# Patient Record
Sex: Female | Born: 1995 | Race: Black or African American | Hispanic: No | Marital: Single | Smoking: Former smoker
Health system: Southern US, Community
[De-identification: ages and names within clinical notes are randomized; demographics above are authoritative.]

## PROBLEM LIST (undated history)

## (undated) DIAGNOSIS — Z8619 Personal history of other infectious and parasitic diseases: Secondary | ICD-10-CM

## (undated) DIAGNOSIS — K0889 Other specified disorders of teeth and supporting structures: Secondary | ICD-10-CM

## (undated) DIAGNOSIS — R112 Nausea with vomiting, unspecified: Secondary | ICD-10-CM

## (undated) HISTORY — DX: Nausea with vomiting, unspecified: R11.2

## (undated) HISTORY — DX: Personal history of other infectious and parasitic diseases: Z86.19

## (undated) HISTORY — DX: Other specified disorders of teeth and supporting structures: K08.89

---
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
## 2017-06-23 DIAGNOSIS — Z130 Encounter for screening for diseases of the blood and blood-forming organs and certain disorders involving the immune mechanism: Secondary | ICD-10-CM | POA: Diagnosis not present

## 2017-06-23 DIAGNOSIS — N938 Other specified abnormal uterine and vaginal bleeding: Secondary | ICD-10-CM | POA: Diagnosis not present

## 2017-06-23 DIAGNOSIS — Z309 Encounter for contraceptive management, unspecified: Secondary | ICD-10-CM | POA: Diagnosis not present

## 2017-06-23 DIAGNOSIS — Z23 Encounter for immunization: Secondary | ICD-10-CM | POA: Diagnosis not present

## 2017-06-23 DIAGNOSIS — Z3200 Encounter for pregnancy test, result unknown: Secondary | ICD-10-CM | POA: Diagnosis not present

## 2018-03-14 HISTORY — PX: THERAPEUTIC ABORTION: SHX798

## 2019-02-18 ENCOUNTER — Other Ambulatory Visit: Payer: Self-pay

## 2019-02-18 ENCOUNTER — Ambulatory Visit
Admission: EM | Admit: 2019-02-18 | Discharge: 2019-02-18 | Disposition: A | Discharge status: Home / Self Care | Payer: Self-pay | Attending: Family Medicine | Admitting: Family Medicine

## 2019-02-18 ENCOUNTER — Encounter: Payer: Self-pay | Admitting: Emergency Medicine

## 2019-02-18 DIAGNOSIS — R112 Nausea with vomiting, unspecified: Secondary | ICD-10-CM

## 2019-02-18 DIAGNOSIS — Z3490 Encounter for supervision of normal pregnancy, unspecified, unspecified trimester: Secondary | ICD-10-CM

## 2019-02-18 DIAGNOSIS — Z3201 Encounter for pregnancy test, result positive: Secondary | ICD-10-CM

## 2019-02-18 LAB — URINALYSIS, COMPLETE (UACMP) WITH MICROSCOPIC
Glucose, UA: NEGATIVE mg/dL
Hgb urine dipstick: NEGATIVE
Ketones, ur: >160 mg/dL — AB
Leukocytes,Ua: NEGATIVE
Nitrite: NEGATIVE
Protein, ur: 30 mg/dL — AB
RBC / HPF: NONE SEEN RBC/hpf (ref 0–5)
Specific Gravity, Urine: >1.03 — ABNORMAL HIGH (ref 1.005–1.030)
pH: 6 (ref 5.0–8.0)

## 2019-02-18 LAB — PREGNANCY, URINE: Preg Test, Ur: POSITIVE — AB

## 2019-02-18 NOTE — ED Provider Notes (Signed)
[facility]    CSN: [id number] Arrival date & time: 02/18/19  1225     History   Chief Complaint Chief Complaint  Patient presents with  . Emesis  . Generalized Body Aches    HPI Mary Cabrera is a 23 y.o. female.   23 yo female with a c/o nausea and vomiting since yesterday and several days of fatigue. Denies any fevers, chills, abdominal pain, diarrhea, urinary symptoms. States her last normal menstrual period was at the end of May and has had unprotected sexual intercourse.    Emesis   History reviewed. No pertinent past medical history.  There are no active problems to display for this patient.   History reviewed. No pertinent surgical history.  OB History   No obstetric history on file.      Home Medications    Prior to Admission medications   Not on File    Family History History reviewed. No pertinent family history.  Social History Social History   Tobacco Use  . Smoking status: Never Smoker  . Smokeless tobacco: Never Used  Substance Use Topics  . Alcohol use: Not Currently  . Drug use: Never     Allergies   Patient has no known allergies.   Review of Systems Review of Systems  Gastrointestinal: Positive for vomiting.     Physical Exam Triage Vital Signs ED Triage Vitals  Enc Vitals Group     BP 02/18/19 1249 (!) 128/99     Pulse Rate 02/18/19 1249 90     Resp 02/18/19 1249 18     Temp 02/18/19 1249 98.6 F (37 C)     Temp Source 02/18/19 1249 Oral     SpO2 02/18/19 1249 100 %     Weight 02/18/19 1249 98 lb (44.5 kg)     Height 02/18/19 1249 5\' 1"  (1.549 m)     Head Circumference --      Peak Flow --      Pain Score 02/18/19 1248 0     Pain Loc --      Pain Edu? --      Excl. in [place]? --    No data found.  Updated Vital Signs BP (!) 128/99 (BP Location: Right Arm)   Pulse 90   Temp 98.6 F (37 C) (Oral)   Resp 18   Ht 5\' 1"  (1.549 m)   Wt 44.5 kg   LMP 01/04/2019   SpO2 100%   BMI 18.52 kg/m    Visual Acuity Right Eye Distance:   Left Eye Distance:   Bilateral Distance:    Right Eye Near:   Left Eye Near:    Bilateral Near:     Physical Exam Vitals signs and nursing note reviewed.  Constitutional:      General: She is not in acute distress.    Appearance: She is not toxic-appearing or diaphoretic.  Abdominal:     General: There is no distension.     Palpations: Abdomen is soft.  Neurological:     Mental Status: She is alert.      UC Treatments / Results  Labs (all labs ordered are listed, but only abnormal results are displayed) Labs Reviewed  PREGNANCY, URINE - Abnormal; Notable for the following components:      Result Value   Preg Test, Ur POSITIVE (*)    All other components within normal limits  URINALYSIS, COMPLETE (UACMP) WITH MICROSCOPIC - Abnormal; Notable for the following components:   Specific  Gravity, Urine >1.030 (*)    Bilirubin Urine SMALL (*)    Ketones, ur >160 (*)    Protein, ur 30 (*)    Bacteria, UA RARE (*)    All other components within normal limits  URINE CULTURE    EKG   Radiology No results found.  Procedures Procedures (including critical care time)  Medications Ordered in UC Medications - No data to display  Initial Impression / Assessment and Plan / UC Course  I have reviewed the triage vital signs and the nursing notes.  Pertinent labs & imaging results that were available during my care of the patient were reviewed by me and considered in my medical decision making (see chart for details).      Final Clinical Impressions(s) / UC Diagnoses   Final diagnoses:  Pregnancy, unspecified gestational age  Non-intractable vomiting with nausea, unspecified vomiting type     Discharge Instructions     Over the counter Vitamin B6 (25mg ) plus half unisom tablet  Ginger products for nausea Start prenatal vitamin Establish care and follow up with an OB specialist    ED Prescriptions    None     1. Lab  results and diagnosis reviewed with patient 2. Recommend supportive treatment as above 3. Go to emergency department if vomiting symptoms worsen and unable to keep fluids down  4. Follow-up prn if symptoms worsen or don't improve   Controlled Substance Prescriptions [place] Controlled Substance Registry consulted? Not Applicable   Norval Gable, MD 02/18/19 [phone]

## 2019-02-18 NOTE — ED Triage Notes (Signed)
Patient states she started vomiting yesterday. She also c/o fatigue, generalized body aches and headache. No appetite since Sunday.

## 2019-02-18 NOTE — Discharge Instructions (Signed)
Over the counter Vitamin B6 (25mg ) plus half unisom tablet  Ginger products for nausea Start prenatal vitamin Establish care and follow up with an OB specialist

## 2019-02-19 LAB — URINE CULTURE: Culture: NO GROWTH

## 2019-03-21 LAB — OB RESULTS CONSOLE RUBELLA ANTIBODY, IGM
Rubella: IMMUNE
Rubella: IMMUNE

## 2019-03-24 ENCOUNTER — Ambulatory Visit: Payer: Medicaid Other | Admitting: Nurse Practitioner

## 2019-03-24 ENCOUNTER — Other Ambulatory Visit: Payer: Self-pay

## 2019-03-24 VITALS — BP 112/71 | Temp 98.0°F | Wt 99.2 lb

## 2019-03-24 DIAGNOSIS — N760 Acute vaginitis: Secondary | ICD-10-CM

## 2019-03-24 DIAGNOSIS — Z3480 Encounter for supervision of other normal pregnancy, unspecified trimester: Secondary | ICD-10-CM

## 2019-03-24 DIAGNOSIS — B9689 Other specified bacterial agents as the cause of diseases classified elsewhere: Secondary | ICD-10-CM

## 2019-03-24 LAB — URINALYSIS
Bilirubin, UA: NEGATIVE
Glucose, UA: NEGATIVE
Ketones, UA: NEGATIVE
Leukocytes,UA: NEGATIVE
Nitrite, UA: NEGATIVE
Protein,UA: NEGATIVE
Specific Gravity, UA: 1.02 (ref 1.005–1.030)
Urobilinogen, Ur: 0.2 mg/dL (ref 0.2–1.0)
pH, UA: 7 (ref 5.0–7.5)

## 2019-03-24 LAB — WET PREP FOR TRICH, YEAST, CLUE
Trichomonas Exam: NEGATIVE
Yeast Exam: NEGATIVE

## 2019-03-24 LAB — HIV ANTIBODY (ROUTINE TESTING W REFLEX): HIV 1&2 Ab, 4th Generation: NONREACTIVE

## 2019-03-24 LAB — HEMOGLOBIN, FINGERSTICK: Hemoglobin: 13.9 g/dL (ref 11.1–15.9)

## 2019-03-24 MED ORDER — METRONIDAZOLE 250 MG PO TABS: 250.0000 mg | ORAL_TABLET | Freq: Three times a day (TID) | ORAL | 0 refills | Status: AC

## 2019-03-24 NOTE — Patient Instructions (Signed)

## 2019-03-24 NOTE — Progress Notes (Signed)
[place] [address] [phone]  INITIAL PRENATAL VISIT NOTE  Subjective:  Mary Cabrera is a 23 y.o. G2P0010 at [redacted]w[redacted]d being seen today to start prenatal care at the [facility].  She is currently monitored for the following issues for this low-risk pregnancy and does not have a problem list on file.  Patient reports bleeding and /spotting that has resolved after last sexual encounter at the end of July.  Contractions: Not present. Vag. Bleeding: None.  Movement: Absent. Denies leaking of fluid.   The following portions of the patient's history were reviewed and updated as appropriate: allergies, current medications, past family history, past medical history, past social history, past surgical history and problem list. Problem list updated.  Objective:   Vitals:   03/24/19 1003  BP: 112/71  Temp: 98 F (36.7 C)  Weight: 99 lb 3.2 oz (45 kg)    Fetal Status: Fetal Heart Rate (bpm): 150   Movement: Absent     Physical Exam Vitals signs and nursing note reviewed.  Constitutional:      General: She is not in acute distress.    Appearance: Normal appearance. She is well-developed.  HENT:     Head: Normocephalic and atraumatic.     Mouth/Throat:     Lips: Pink.     Mouth: Mucous membranes are moist.     Dentition: Normal dentition. No dental caries.     Pharynx: Oropharynx is clear. Uvula midline.  Neck:     Musculoskeletal: Full passive range of motion without pain.     Thyroid: No thyroid mass or thyromegaly.  Cardiovascular:     Rate and Rhythm: Normal rate.  Pulmonary:     Effort: Pulmonary effort is normal.     Breath sounds: Normal breath sounds.  Chest:     Breasts: Breasts are symmetrical.        Right: Normal. No mass, nipple discharge or skin change.        Left: Normal. No mass, nipple discharge or skin change.  Abdominal:     General:  Abdomen is flat.     Palpations: Abdomen is soft.     Tenderness: There is no abdominal tenderness.     Comments: Gravid   Genitourinary:    General: Normal vulva.     Exam position: Lithotomy position.     Pubic Area: No rash.      Labia:        Right: No rash.        Left: No rash.      Vagina: Normal. No vaginal discharge.     Cervix: No cervical motion tenderness or friability.     Uterus: Normal. Enlarged (FH - 1 FB below SP). Not tender.      Adnexa: Right adnexa normal and left adnexa normal.     Rectum: Normal. No external hemorrhoid.  Lymphadenopathy:     Cervical: No cervical adenopathy.     Upper Body:     Right upper body: No axillary adenopathy.     Left upper body: No axillary adenopathy.  Skin:    General: Skin is warm and dry.  Neurological:     Mental Status: She is alert.   Nose ring noted Moderate amt yellowish discharge noted - > 4.5 ph, no foul odor noted   Assessment and Plan:  Pregnancy: G2P0010 at [redacted]w[redacted]d  1. Supervision of other normal pregnancy, antepartum  Client admits to doing well Reviewed with client OB ROS flow sheet questions  - HGB FRAC. W/SOLUBILITY - HIV [facility] - Prenatal profile without Varicella or Rubella - Chlamydia/GC NAA, Confirmation - IGP, Aptima HPV - Hemoglobin - WET PREP FOR TRICH, YEAST, CLUE - provider to review - US OB Comp Less 14 Wks; Future - Urinalysis (Urine Dip)  - Urinalysis - Urine Culture - Urine Drug Screen  Await results  2. Bacterial vaginosis Please treat client for BV per standing order  - metroNIDAZOLE (FLAGYL) 250 MG tablet; Take 1 tablet (250 mg total) by mouth 3 (three) times daily for 7 days.  Dispense: 1 tablet; Refill: 0  Client verbalizes understanding and is in agreement with plan of care   Discussed overview of care and coordination with [facility].   Reviewed Centering pregnancy as standard of care at  [facility], oriented to room and showed video. Based on EDD, plan for Cycle  - 73E .    Preterm labor symptoms and general obstetric precautions including but not limited to vaginal bleeding, contractions, leaking of fluid and fetal movement were reviewed in detail with the patient.  Please refer to After Visit Summary for other counseling recommendations.   Return in about 4 weeks (around 04/21/2019) for telehealth, routine prenatal care.  Future Appointments  Date Time Provider Department Center  04/18/2019 10:20 AM [facility] PROVIDER [facility] None    Donn PieriniKarla W Daden Mahany, NP

## 2019-03-24 NOTE — Progress Notes (Signed)
In for new ob; has PNV; initial labs today Debera Lat, RN +BV treated per Wilburn Mylar, FNP; informed will be called by [facility] scheduling for 1st screen Debera Lat, RN

## 2019-03-26 LAB — HGB FRAC. W/SOLUBILITY
Hgb A2 Quant: 2.4 % (ref 1.8–3.2)
Hgb A: 97.6 % (ref 96.4–98.8)
Hgb C: 0 %
Hgb F Quant: 0 % (ref 0.0–2.0)
Hgb S: 0 %
Hgb Solubility: NEGATIVE
Hgb Variant: 0 %

## 2019-03-26 LAB — PAP IG (IMAGE GUIDED): PAP Smear Comment: 0

## 2019-03-26 LAB — CBC/D/PLT+RPR+RH+ABO+AB SCR
Antibody Screen: NEGATIVE
Basophils Absolute: 0 10*3/uL (ref 0.0–0.2)
Basos: 0 %
EOS (ABSOLUTE): 0 10*3/uL (ref 0.0–0.4)
Eos: 0 %
Hematocrit: 37.4 % (ref 34.0–46.6)
Hemoglobin: 13.4 g/dL (ref 11.1–15.9)
Hepatitis B Surface Ag: NEGATIVE
Immature Grans (Abs): 0 10*3/uL (ref 0.0–0.1)
Immature Granulocytes: 0 %
Lymphocytes Absolute: 1.6 10*3/uL (ref 0.7–3.1)
Lymphs: 18 %
MCH: 30.8 pg (ref 26.6–33.0)
MCHC: 35.8 g/dL — ABNORMAL HIGH (ref 31.5–35.7)
MCV: 86 fL (ref 79–97)
Monocytes Absolute: 0.4 10*3/uL (ref 0.1–0.9)
Monocytes: 5 %
Neutrophils Absolute: 7 10*3/uL (ref 1.4–7.0)
Neutrophils: 77 %
Platelets: 193 10*3/uL (ref 150–450)
RBC: 4.35 x10E6/uL (ref 3.77–5.28)
RDW: 11.8 % (ref 11.7–15.4)
RPR Ser Ql: NONREACTIVE
Rh Factor: POSITIVE
WBC: 9.1 10*3/uL (ref 3.4–10.8)

## 2019-03-26 LAB — VARICELLA ZOSTER ANTIBODY, IGG: Varicella zoster IgG: 2170 index (ref >165)

## 2019-03-26 LAB — URINE CULTURE: Organism ID, Bacteria: NO GROWTH

## 2019-03-27 LAB — 789231 7+OXYCODONE-BUND
Amphetamines, Urine: NEGATIVE ng/mL
BENZODIAZ UR QL: NEGATIVE ng/mL
Barbiturate screen, urine: NEGATIVE ng/mL
Cocaine (Metab.): NEGATIVE ng/mL
OPIATE SCREEN URINE: NEGATIVE ng/mL
Oxycodone/Oxymorphone, Urine: NEGATIVE ng/mL
PCP Quant, Ur: NEGATIVE ng/mL

## 2019-03-27 LAB — CANNABINOID CONFIRMATION, UR: CANNABINOIDS: NEGATIVE

## 2019-04-01 ENCOUNTER — Encounter: Payer: Self-pay | Admitting: Nurse Practitioner

## 2019-04-01 DIAGNOSIS — R825 Elevated urine levels of drugs, medicaments and biological substances: Secondary | ICD-10-CM | POA: Insufficient documentation

## 2019-04-01 HISTORY — DX: Elevated urine levels of drugs, medicaments and biological substances: R82.5

## 2019-04-02 ENCOUNTER — Telehealth: Payer: Self-pay

## 2019-04-02 ENCOUNTER — Encounter: Payer: Self-pay | Admitting: Nurse Practitioner

## 2019-04-02 DIAGNOSIS — A568 Sexually transmitted chlamydial infection of other sites: Secondary | ICD-10-CM

## 2019-04-02 DIAGNOSIS — Z3480 Encounter for supervision of other normal pregnancy, unspecified trimester: Secondary | ICD-10-CM

## 2019-04-02 DIAGNOSIS — O98312 Other infections with a predominantly sexual mode of transmission complicating pregnancy, second trimester: Secondary | ICD-10-CM | POA: Insufficient documentation

## 2019-04-02 HISTORY — DX: Encounter for supervision of other normal pregnancy, unspecified trimester: Z34.80

## 2019-04-02 HISTORY — DX: Other infections with a predominantly sexual mode of transmission complicating pregnancy, second trimester: A56.8

## 2019-04-02 LAB — CHLAMYDIA/GC NAA, CONFIRMATION
Chlamydia trachomatis, NAA: POSITIVE — AB
Neisseria gonorrhoeae, NAA: NEGATIVE

## 2019-04-02 LAB — C. TRACHOMATIS NAA, CONFIRM: C. trachomatis NAA, Confirm: POSITIVE — AB

## 2019-04-02 NOTE — Progress Notes (Signed)
Updated client problem list to include "supervision of prenatal care"

## 2019-04-02 NOTE — Telephone Encounter (Signed)
Call to client; confirmed identity; and discussed +Chlamydia; scheduled 04/03/19 for Tx and aware to eat prior to appt. Debera Lat, RN

## 2019-04-03 ENCOUNTER — Other Ambulatory Visit: Payer: Medicaid Other

## 2019-04-03 ENCOUNTER — Other Ambulatory Visit: Payer: Self-pay | Admitting: Advanced Practice Midwife

## 2019-04-03 ENCOUNTER — Other Ambulatory Visit: Payer: Self-pay

## 2019-04-03 DIAGNOSIS — A749 Chlamydial infection, unspecified: Secondary | ICD-10-CM

## 2019-04-03 DIAGNOSIS — Z3480 Encounter for supervision of other normal pregnancy, unspecified trimester: Secondary | ICD-10-CM

## 2019-04-03 MED ORDER — PRENATAL VITAMINS 28-0.8 MG PO TABS: 1.0000 | ORAL_TABLET | Freq: Every day | ORAL | 11 refills | Status: DC

## 2019-04-03 MED ORDER — AZITHROMYCIN 500 MG PO TABS
1000.0000 mg | ORAL_TABLET | Freq: Once | ORAL | Status: AC
  Administered 2019-04-03: 1000 mg via ORAL

## 2019-04-07 ENCOUNTER — Other Ambulatory Visit: Payer: Self-pay

## 2019-04-07 DIAGNOSIS — Z20822 Contact with and (suspected) exposure to covid-19: Secondary | ICD-10-CM

## 2019-04-08 ENCOUNTER — Telehealth: Payer: Self-pay | Admitting: Family Medicine

## 2019-04-08 LAB — NOVEL CORONAVIRUS, NAA: SARS-CoV-2, NAA: NOT DETECTED

## 2019-04-08 NOTE — Telephone Encounter (Signed)
Returned call--reports has not been to pharmacy, wanted to check on script; informed that provider sent in and to let us know if they don't have it. Debera Lat, RN

## 2019-04-18 ENCOUNTER — Ambulatory Visit: Payer: Medicaid Other

## 2019-05-01 ENCOUNTER — Encounter: Payer: Self-pay | Admitting: Physician Assistant

## 2019-05-01 DIAGNOSIS — Z3480 Encounter for supervision of other normal pregnancy, unspecified trimester: Secondary | ICD-10-CM

## 2019-05-01 NOTE — Addendum Note (Signed)
Addended by: Cletis Media on: 05/01/2019 10:58 AM   Modules accepted: Orders

## 2019-05-05 ENCOUNTER — Emergency Department
Admission: EM | Admit: 2019-05-05 | Discharge: 2019-05-06 | Disposition: A | Discharge status: Home / Self Care | Payer: Medicaid Other | Attending: Emergency Medicine | Admitting: Emergency Medicine

## 2019-05-05 ENCOUNTER — Other Ambulatory Visit: Payer: Self-pay

## 2019-05-05 DIAGNOSIS — Z3A16 16 weeks gestation of pregnancy: Secondary | ICD-10-CM | POA: Diagnosis not present

## 2019-05-05 DIAGNOSIS — Z87891 Personal history of nicotine dependence: Secondary | ICD-10-CM | POA: Diagnosis not present

## 2019-05-05 DIAGNOSIS — N939 Abnormal uterine and vaginal bleeding, unspecified: Secondary | ICD-10-CM

## 2019-05-05 DIAGNOSIS — F1210 Cannabis abuse, uncomplicated: Secondary | ICD-10-CM | POA: Insufficient documentation

## 2019-05-05 DIAGNOSIS — O4692 Antepartum hemorrhage, unspecified, second trimester: Secondary | ICD-10-CM | POA: Diagnosis not present

## 2019-05-05 DIAGNOSIS — O26892 Other specified pregnancy related conditions, second trimester: Secondary | ICD-10-CM | POA: Diagnosis present

## 2019-05-05 LAB — COMPREHENSIVE METABOLIC PANEL
ALT: 15 U/L (ref 0–44)
AST: 20 U/L (ref 15–41)
Albumin: 3.5 g/dL (ref 3.5–5.0)
Alkaline Phosphatase: 47 U/L (ref 38–126)
Anion gap: 9 (ref 5–15)
BUN: 10 mg/dL (ref 6–20)
CO2: 23 mmol/L (ref 22–32)
Calcium: 8.7 mg/dL — ABNORMAL LOW (ref 8.9–10.3)
Chloride: 104 mmol/L (ref 98–111)
Creatinine, Ser: 0.44 mg/dL (ref 0.44–1.00)
GFR calc Af Amer: >60 mL/min (ref >60)
GFR calc non Af Amer: >60 mL/min (ref >60)
Glucose, Bld: 93 mg/dL (ref 70–99)
Potassium: 3.4 mmol/L — ABNORMAL LOW (ref 3.5–5.1)
Sodium: 136 mmol/L (ref 135–145)
Total Bilirubin: 0.4 mg/dL (ref 0.3–1.2)
Total Protein: 6.5 g/dL (ref 6.5–8.1)

## 2019-05-05 LAB — CBC
HCT: 33.4 % — ABNORMAL LOW (ref 36.0–46.0)
Hemoglobin: 11.6 g/dL — ABNORMAL LOW (ref 12.0–15.0)
MCH: 30.5 pg (ref 26.0–34.0)
MCHC: 34.7 g/dL (ref 30.0–36.0)
MCV: 87.9 fL (ref 80.0–100.0)
Platelets: 168 10*3/uL (ref 150–400)
RBC: 3.8 MIL/uL — ABNORMAL LOW (ref 3.87–5.11)
RDW: 12.9 % (ref 11.5–15.5)
WBC: 9 10*3/uL (ref 4.0–10.5)
nRBC: 0 % (ref 0.0–0.2)

## 2019-05-05 NOTE — ED Notes (Signed)
[place] said 15-20 minutes until they can get patient, patient and MD updated

## 2019-05-05 NOTE — ED Triage Notes (Addendum)
Pt states she started having vaginal bleeding during intercourse, pt is [redacted] weeks pregnant. Pt denies any pain at this time. This is first pregnancy, has had normal visits at [facility].

## 2019-05-05 NOTE — ED Notes (Signed)
ED Provider at bedside. 

## 2019-05-05 NOTE — ED Provider Notes (Signed)
[facility] Emergency Department Provider Note   First MD Initiated Contact with Patient 05/05/19 2312     (approximate)  I have reviewed the triage vital signs and the nursing notes.   HISTORY  Chief Complaint Vaginal Bleeding   HPI Mary Cabrera is a 23 y.o. female G1, P0 [redacted] weeks pregnant presents to the emergency department secondary to painless vaginal bleeding which patient states began approximately 20 minutes before arrival during intercourse.        Past Medical History:  Diagnosis Date  . History of Lyme disease 2012 - 2013  . Nausea & vomiting    with pregnancy  . Pain in a tooth or teeth     Patient Active Problem List   Diagnosis Date Noted  . Chlamydia trachomatis infection in pregnancy in second trimester 04/02/2019  . Supervision of other normal pregnancy, antepartum 04/02/2019  . Positive urine drug screen 04/01/2019    Past Surgical History:  Procedure Laterality Date  . THERAPEUTIC ABORTION  03/2018    Prior to Admission medications   Medication Sig Start Date End Date Taking? Authorizing Provider  Prenatal Vit-Fe Fumarate-FA (MULTIVITAMIN-PRENATAL) 27-0.8 MG TABS tablet Take 1 tablet by mouth daily at 12 noon.    [provider]  Prenatal Vit-Fe Fumarate-FA (PRENATAL VITAMINS) 28-0.8 MG TABS Take 1 tablet by mouth daily. 04/03/19   Herbie Saxon, CNM    Allergies Nickel  Family History  Problem Relation Age of Onset  . Hypertension Mother   . Thyroid disease Mother   . Hypertension Father   . Hypertension Maternal Grandmother   . Heart disease Maternal Grandmother   . Thyroid disease Maternal Grandmother   . Breast cancer Maternal Great-grandmother     Social History Social History   Tobacco Use  . Smoking status: Former Smoker    Types: Cigarettes    Quit date: 02/12/2019    Years since quitting: 0.2  . Smokeless tobacco: Never Used  Substance Use Topics  . Alcohol use: Not Currently    Comment: Passt ETOH use of beer, liquor.  . Drug use: Not Currently    Types: Marijuana    Review of Systems Constitutional: No fever/chills Eyes: No visual changes. ENT: No sore throat. Cardiovascular: Denies chest pain. Respiratory: Denies shortness of breath. Gastrointestinal: No abdominal pain.  No nausea, no vomiting.  No diarrhea.  No constipation. Genitourinary: Positive for vaginal bleeding Musculoskeletal: Negative for neck pain.  Negative for back pain. Integumentary: Negative for rash. Neurological: Negative for headaches, focal weakness or numbness.  ____________________________________________   PHYSICAL EXAM:  VITAL SIGNS: ED Triage Vitals  Enc Vitals Group     BP 05/05/19 2303 135/79     Pulse Rate 05/05/19 2303 94     Resp 05/05/19 2303 20     Temp 05/05/19 2303 98.4 F (36.9 C)     Temp Source 05/05/19 2303 Oral     SpO2 05/05/19 2303 99 %     Weight 05/05/19 2304 49 kg (108 lb)     Height 05/05/19 2304 1.575 m (5\' 2" )     Head Circumference --      Peak Flow --      Pain Score --      Pain Loc --      Pain Edu? --      Excl. in [place]? --     Constitutional: Alert and oriented.  Eyes: Conjunctivae are normal.  Mouth/Throat: Mucous membranes are moist. Neck: No stridor.  No meningeal signs.   Cardiovascular: Normal rate, regular rhythm. Good peripheral circulation. Grossly normal heart sounds. Respiratory: Normal respiratory effort.  No retractions. Gastrointestinal: Soft and nontender. No distention.  Genitourinary: Approximately 8 mL's of dark blood noted in vagina.  Cervix closed with no active bleeding from the cervix Musculoskeletal: No lower extremity tenderness nor edema. No gross deformities of extremities. Neurologic:  Normal speech and language. No gross focal neurologic deficits are appreciated.  Skin:  Skin is warm, dry and intact. Psychiatric: Mood and affect are normal. Speech and behavior are normal.   ____________________________________________   LABS (all labs ordered are listed, but only abnormal results are displayed)  Labs Reviewed  CBC - Abnormal; Notable for the following components:      Result Value   RBC 3.80 (*)    Hemoglobin 11.6 (*)    HCT 33.4 (*)    All other components within normal limits  COMPREHENSIVE METABOLIC PANEL - Abnormal; Notable for the following components:   Potassium 3.4 (*)    Calcium 8.7 (*)    All other components within normal limits  HCG, QUANTITATIVE, PREGNANCY - Abnormal; Notable for the following components:   hCG, Beta Chain, Quant, S 32,311 (*)    All other components within normal limits  URINALYSIS, COMPLETE (UACMP) WITH MICROSCOPIC - Abnormal; Notable for the following components:   Color, Urine RED (*)    APPearance TURBID (*)    Glucose, UA   (*)    Value: TEST NOT REPORTED DUE TO COLOR INTERFERENCE OF URINE PIGMENT   Hgb urine dipstick   (*)    Value: TEST NOT REPORTED DUE TO COLOR INTERFERENCE OF URINE PIGMENT   Bilirubin Urine   (*)    Value: TEST NOT REPORTED DUE TO COLOR INTERFERENCE OF URINE PIGMENT   Ketones, ur   (*)    Value: TEST NOT REPORTED DUE TO COLOR INTERFERENCE OF URINE PIGMENT   Protein, ur   (*)    Value: TEST NOT REPORTED DUE TO COLOR INTERFERENCE OF URINE PIGMENT   Nitrite   (*)    Value: TEST NOT REPORTED DUE TO COLOR INTERFERENCE OF URINE PIGMENT   Leukocytes,Ua   (*)    Value: TEST NOT REPORTED DUE TO COLOR INTERFERENCE OF URINE PIGMENT   RBC / HPF >50 (*)    All other components within normal limits  ABO/RH    ____________________________________________  RADIOLOGY I, Tolleson N , personally viewed and evaluated these images (plain radiographs) as part of my medical decision making, as well as reviewing the written report by the radiologist.  ED MD interpretation: Single live intrauterine pregnancy 18 weeks 4 days without any explanation for the patient's vaginal bleeding.  No evidence of  placenta previa per radiologist  Official radiology report(s): US Ob Limited  Result Date: 05/06/2019 CLINICAL DATA:  Pregnant patient in second trimester pregnancy with vaginal bleeding today. EXAM: LIMITED OBSTETRIC ULTRASOUND FINDINGS: Number of Fetuses: 1 Heart Rate:  149 bpm Movement: Yes Presentation: Breech Placental Location: Anterior.  Placental lakes noted. Previa: No Amniotic Fluid (Subjective):  Within normal limits. BPD: 4.16 cm 18 w  4 d MATERNAL FINDINGS: Cervix:  Appears closed. Uterus/Adnexae: Neither ovary is visualized. No abnormality visualized. IMPRESSION: Single live intrauterine pregnancy estimated gestational age [redacted] weeks 4 days based on biparietal diameter. Ultrasound [facility] 10/03/2019. No explanation for vaginal bleeding. This exam is performed on an emergent basis and does not comprehensively evaluate fetal size, dating, or anatomy; follow-up complete OB US should be  considered if further fetal assessment is warranted. Electronically Signed   By: Narda Rutherford M.D.   On: 05/06/2019 01:49      Procedures   ____________________________________________   INITIAL IMPRESSION / MDM / ASSESSMENT AND PLAN / ED COURSE  As part of my medical decision making, I reviewed the following data within the electronic MEDICAL RECORD NUMBER  23 year old female presented with above-stated history and physical exam secondary to painless vaginal bleeding in the second trimester with onset during intercourse.  Considered possibly of placenta previa ultrasound revealed no evidence of previa per radiologist.  Patient's blood type is O+.  Also considered possibility of threatened miscarriage as well.  I spoke with the patient and his spouse at length regarding all clinical findings and necessity of following up with OB/GYN  ____________________________________________  FINAL CLINICAL IMPRESSION(S) / ED DIAGNOSES  Final diagnoses:  Vaginal bleeding in pregnancy, second trimester      MEDICATIONS GIVEN DURING THIS VISIT:  Medications - No data to display   ED Discharge Orders    None      *Please note:  HATSUMI TRAUGHBER was evaluated in Emergency Department on 05/06/2019 for the symptoms described in the history of present illness. She was evaluated in the context of the global COVID-19 pandemic, which necessitated consideration that the patient might be at risk for infection with the SARS-CoV-2 virus that causes COVID-19. Institutional protocols and algorithms that pertain to the evaluation of patients at risk for COVID-19 are in a state of rapid change based on information released by regulatory bodies including the CDC and federal and state organizations. These policies and algorithms were followed during the patient's care in the ED.  Some ED evaluations and interventions may be delayed as a result of limited staffing during the pandemic.*  Note:  This document was prepared using Dragon voice recognition software and may include unintentional dictation errors.   Darci Current, MD 05/06/19 [phone]

## 2019-05-05 NOTE — ED Notes (Signed)
EDP performed vaginal exam, patient to go to [place]

## 2019-05-06 ENCOUNTER — Emergency Department: Payer: Medicaid Other

## 2019-05-06 LAB — URINALYSIS, COMPLETE (UACMP) WITH MICROSCOPIC
Bacteria, UA: NONE SEEN
RBC / HPF: >50 RBC/hpf — ABNORMAL HIGH (ref 0–5)
Specific Gravity, Urine: 1.022 (ref 1.005–1.030)

## 2019-05-06 LAB — HCG, QUANTITATIVE, PREGNANCY: hCG, Beta Chain, Quant, S: 32311 m[IU]/mL — ABNORMAL HIGH (ref <5)

## 2019-05-06 LAB — ABO/RH: ABO/RH(D): O POS

## 2019-05-06 NOTE — ED Notes (Signed)
Patient transported to Ultrasound 

## 2019-05-06 NOTE — ED Notes (Signed)
Patient still in US.

## 2019-07-31 ENCOUNTER — Encounter: Payer: Self-pay | Admitting: Family Medicine

## 2019-07-31 NOTE — Progress Notes (Signed)
Pap NIL- 03/24/19. Next in 3 years. RN to send pap card.

## 2019-08-04 NOTE — Progress Notes (Signed)
Pap card mailed. Next pap in 3 years per Newton Kaimen Peine, RN  

## 2019-09-26 IMAGING — US US OB LIMITED
1 series · 14 of 24 positions shown · non-contrast
Comparison: none

CLINICAL DATA: Pregnant patient in second trimester pregnancy with
vaginal bleeding today.

EXAM:
LIMITED OBSTETRIC ULTRASOUND

[Series 1: us ob limited · 0.30mm/px · 24 acquisitions, 14 frames shown]
[im 1/24]
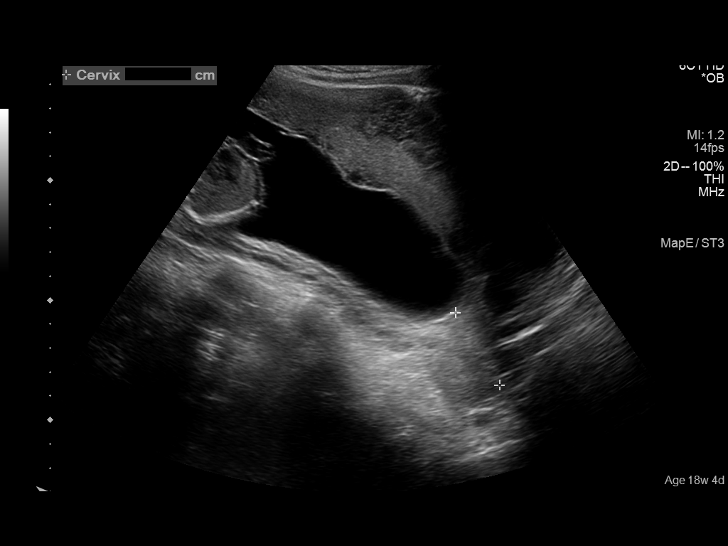
[im 3/24]
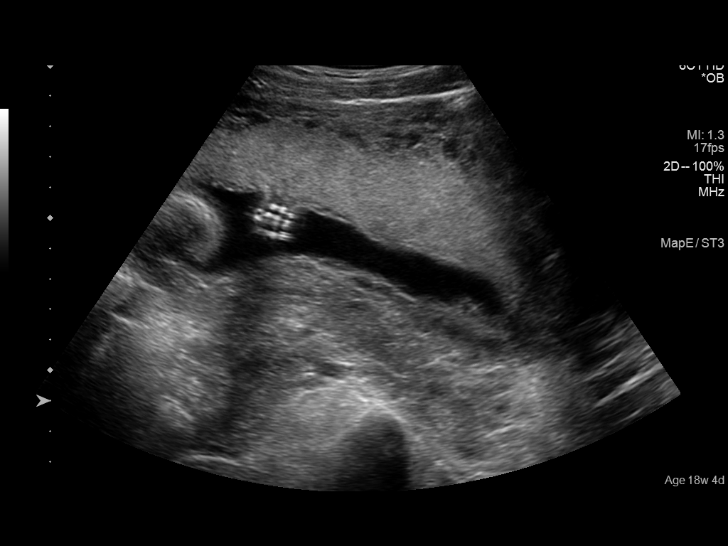
[im 5/24]
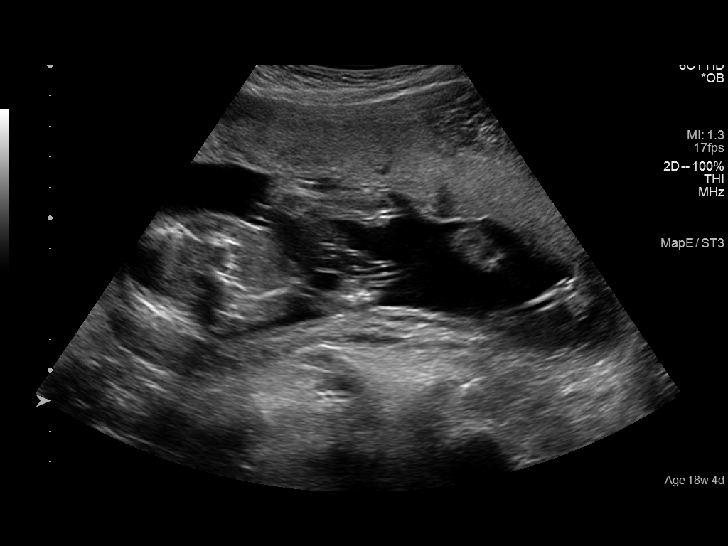
[im 7/24]
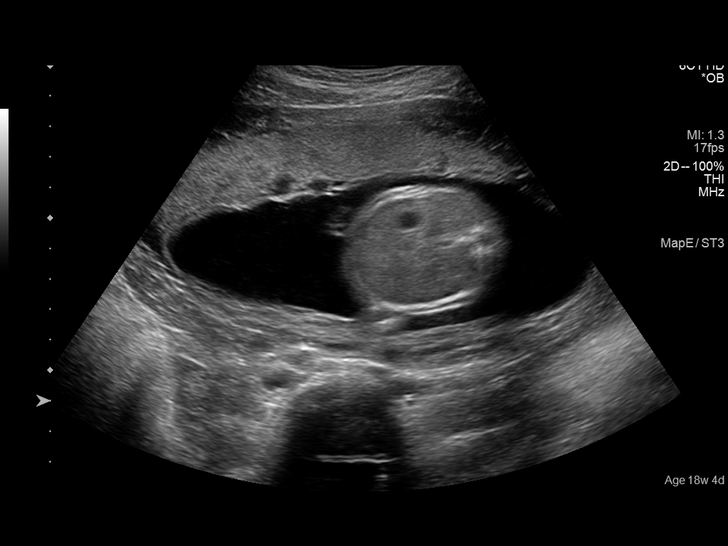
[im 8/24]
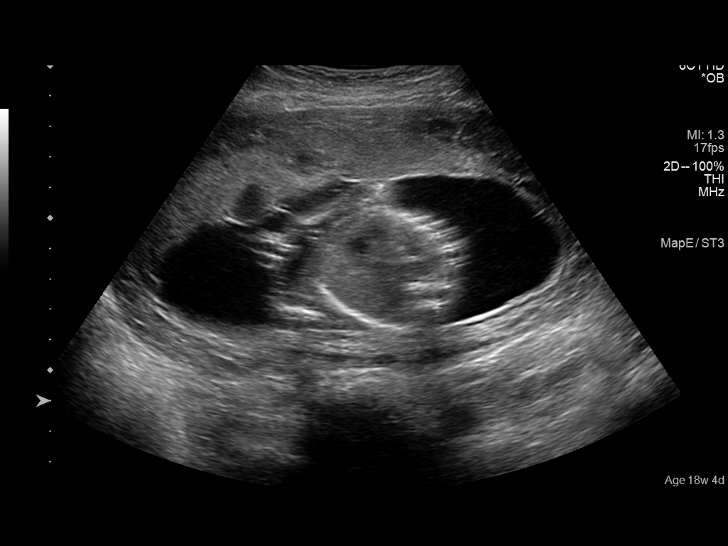
[im 10/24]
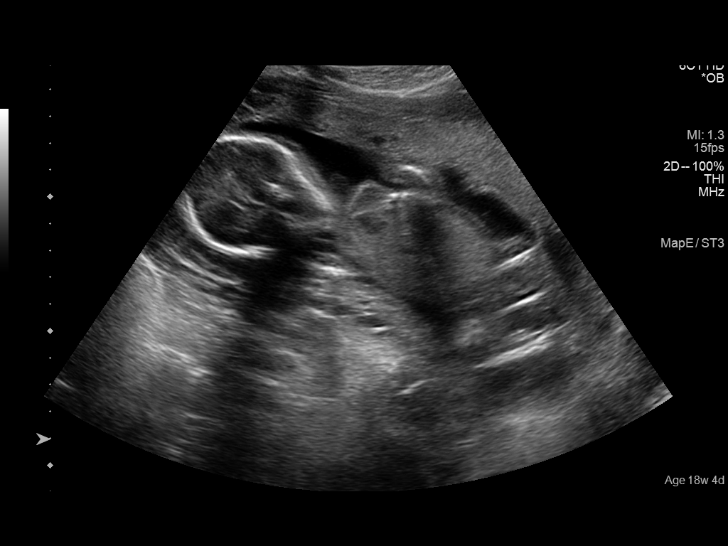
[im 12/24]
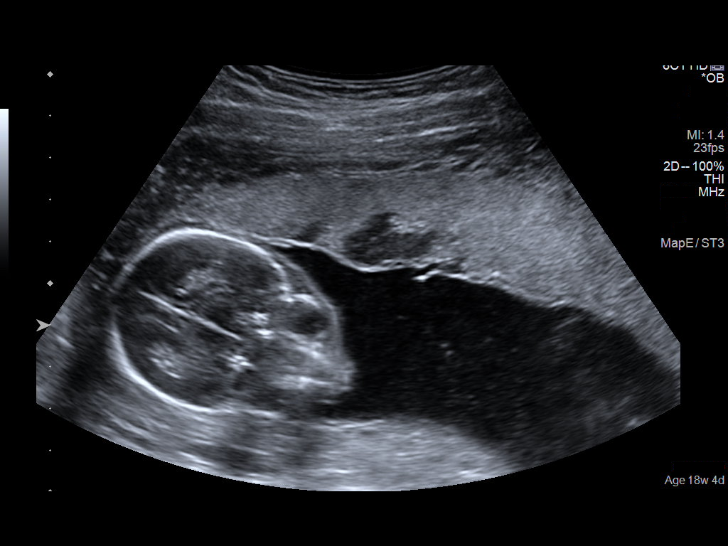
[im 13/24]
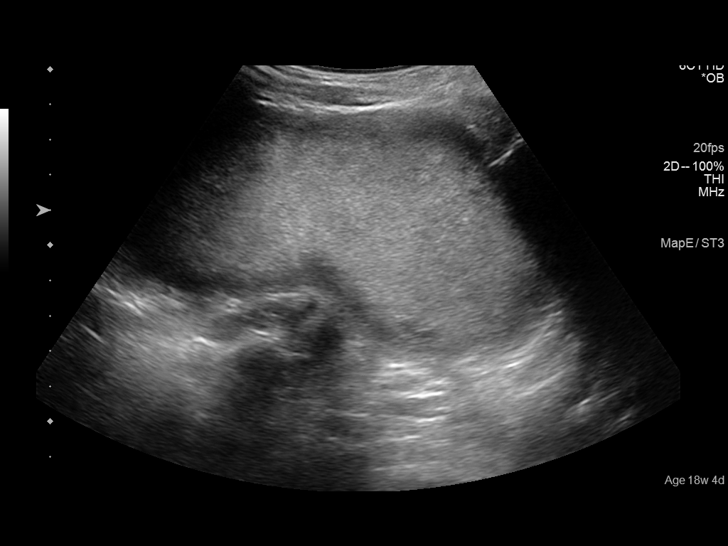
[im 15/24]
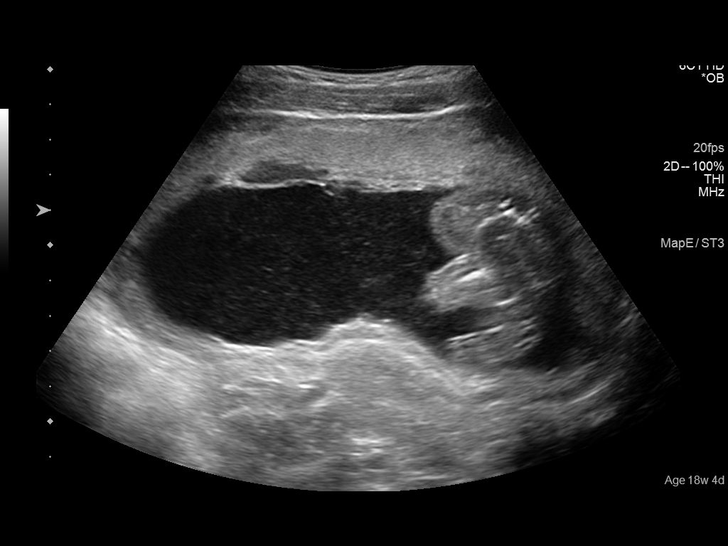
[im 17/24]
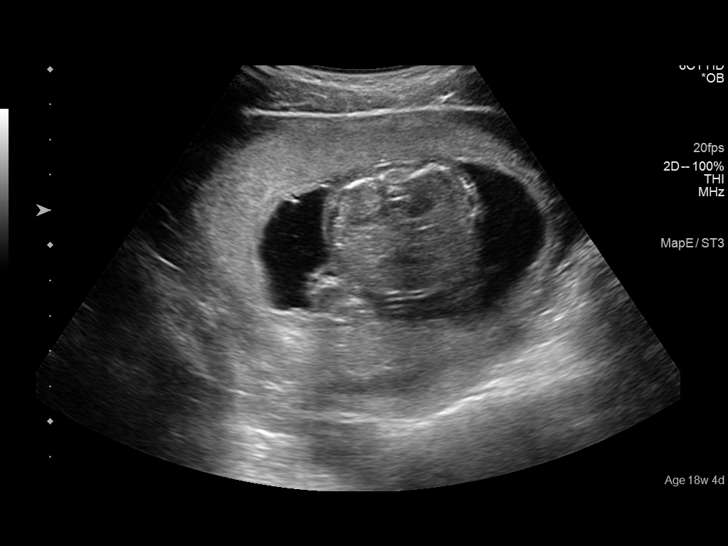
[im 19/24]
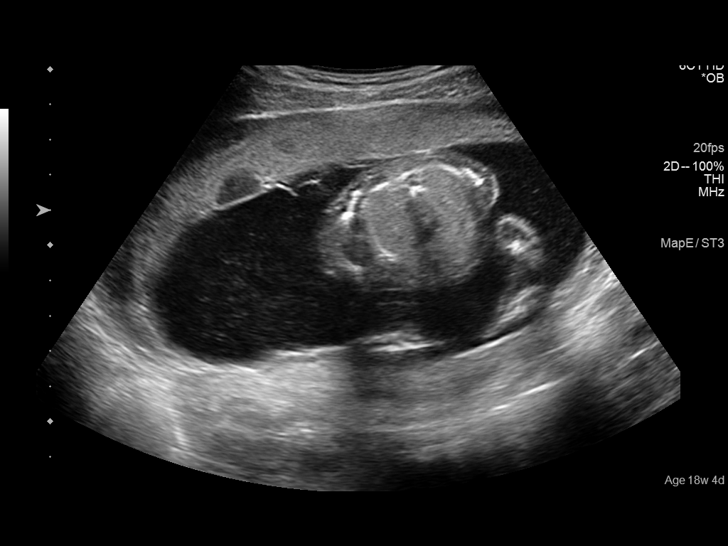
[im 20/24]
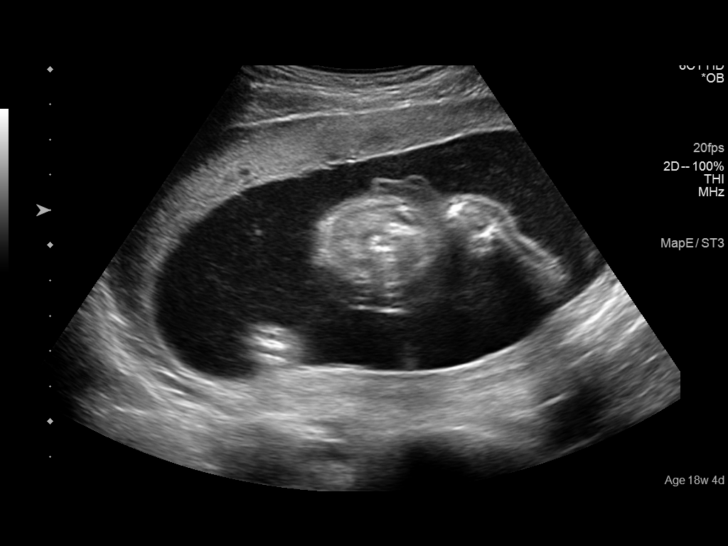
[im 22/24]
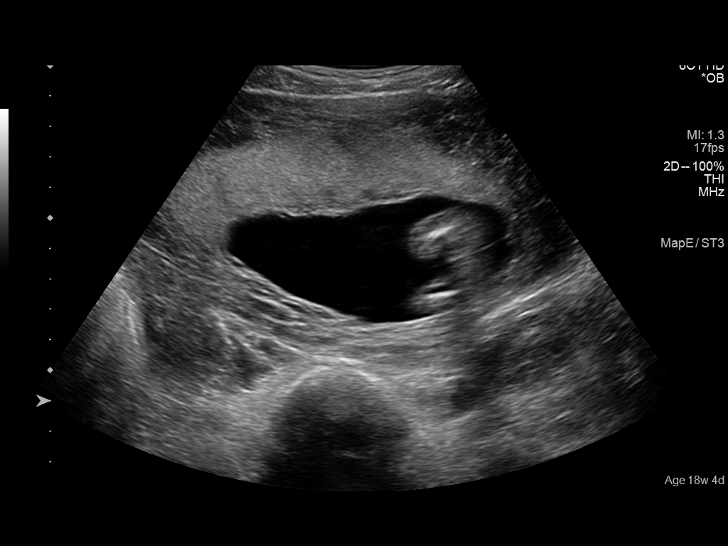
[im 24/24]
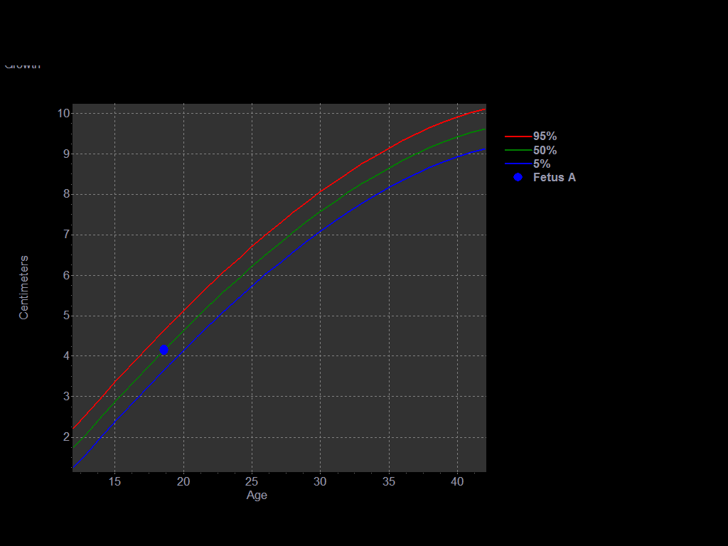

[14 of 24 positions shown; findings below may reference images not displayed]

FINDINGS: Number of Fetuses: 1

Heart Rate:  149 bpm

Movement: Yes

Presentation: Breech

Placental Location: Anterior.  Placental lakes noted.

Previa: No

Amniotic Fluid (Subjective):  Within normal limits.

BPD: 4.16 cm 18 w  4 d

MATERNAL FINDINGS:

Cervix:  Appears closed.

Uterus/Adnexae: Neither ovary is visualized. No abnormality
visualized.
IMPRESSION: Single live intrauterine pregnancy estimated gestational age 18
weeks 4 days based on biparietal diameter. Ultrasound [facility]
10/03/2019. No explanation for vaginal bleeding.

This exam is performed on an emergent basis and does not
comprehensively evaluate fetal size, dating, or anatomy; follow-up
complete OB US should be considered if further fetal assessment is
warranted.

## 2019-11-14 ENCOUNTER — Ambulatory Visit: Payer: Medicaid Other

## 2019-11-15 ENCOUNTER — Ambulatory Visit: Payer: Medicaid Other | Attending: Internal Medicine

## 2019-11-15 DIAGNOSIS — Z23 Encounter for immunization: Secondary | ICD-10-CM

## 2019-11-15 NOTE — Progress Notes (Signed)
   Covid-19 Vaccination Clinic  Name:  Mary Cabrera    MRN: [id number] DOB: February 13, 1996  11/15/2019  Mary Cabrera was observed post Covid-19 immunization for 15 minutes without incident. She was provided with Vaccine Information Sheet and instruction to access the V-Safe system.   Mary Cabrera was instructed to call 911 with any severe reactions post vaccine: Marland Kitchen Difficulty breathing  . Swelling of face and throat  . A fast heartbeat  . A bad rash all over body  . Dizziness and weakness   Immunizations Administered    Name Date Dose VIS Date Route   Pfizer COVID-19 Vaccine 11/15/2019 11:26 AM 0.3 mL 07/25/2019 Intramuscular   Manufacturer: [employer], [employer]   Lot: [id number]   NDC: [address]-5

## 2019-12-06 ENCOUNTER — Ambulatory Visit: Payer: Medicaid Other | Attending: Internal Medicine

## 2019-12-06 DIAGNOSIS — Z23 Encounter for immunization: Secondary | ICD-10-CM

## 2019-12-06 NOTE — Progress Notes (Signed)
   Covid-19 Vaccination Clinic  Name:  Mary Cabrera    MRN: [id number] DOB: 13-Apr-1996  12/06/2019  [name] was observed post Covid-19 immunization for 15 minutes without incident. She was provided with Vaccine Information Sheet and instruction to access the V-Safe system.   [name] was instructed to call 911 with any severe reactions post vaccine: Marland Kitchen Difficulty breathing  . Swelling of face and throat  . A fast heartbeat  . A bad rash all over body  . Dizziness and weakness   Immunizations Administered    Name Date Dose VIS Date Route   Pfizer COVID-19 Vaccine 12/06/2019 11:16 AM 0.3 mL 10/08/2018 Intramuscular   Manufacturer: [employer], [employer]   Lot: [id number]   NDC: [address]-5

## 2020-04-26 ENCOUNTER — Other Ambulatory Visit: Payer: Self-pay | Admitting: *Deleted

## 2020-04-26 ENCOUNTER — Other Ambulatory Visit: Payer: Medicaid Other

## 2020-04-26 DIAGNOSIS — Z20822 Contact with and (suspected) exposure to covid-19: Secondary | ICD-10-CM

## 2020-04-27 LAB — NOVEL CORONAVIRUS, NAA: SARS-CoV-2, NAA: NOT DETECTED

## 2020-04-27 LAB — SARS-COV-2, NAA 2 DAY TAT

## 2020-08-23 ENCOUNTER — Other Ambulatory Visit: Payer: Medicaid Other

## 2020-08-23 DIAGNOSIS — Z20822 Contact with and (suspected) exposure to covid-19: Secondary | ICD-10-CM

## 2020-08-27 LAB — NOVEL CORONAVIRUS, NAA: SARS-CoV-2, NAA: DETECTED — AB

## 2021-11-24 ENCOUNTER — Encounter: Payer: Medicaid Other | Admitting: Obstetrics

## 2021-12-07 ENCOUNTER — Ambulatory Visit (INDEPENDENT_AMBULATORY_CARE_PROVIDER_SITE_OTHER): Payer: Medicaid Other | Admitting: Obstetrics

## 2021-12-07 ENCOUNTER — Other Ambulatory Visit (HOSPITAL_COMMUNITY)
Admission: RE | Admit: 2021-12-07 | Discharge: 2021-12-07 | Disposition: A | Discharge status: Home / Self Care | Payer: Medicaid Other | Source: Ambulatory Visit | Attending: Obstetrics | Admitting: Obstetrics

## 2021-12-07 VITALS — BP 122/83 | HR 56 | Wt 89.0 lb

## 2021-12-07 DIAGNOSIS — Z124 Encounter for screening for malignant neoplasm of cervix: Secondary | ICD-10-CM | POA: Diagnosis present

## 2021-12-07 DIAGNOSIS — Z0000 Encounter for general adult medical examination without abnormal findings: Secondary | ICD-10-CM

## 2021-12-07 DIAGNOSIS — A5909 Other urogenital trichomoniasis: Secondary | ICD-10-CM | POA: Insufficient documentation

## 2021-12-07 DIAGNOSIS — Z113 Encounter for screening for infections with a predominantly sexual mode of transmission: Secondary | ICD-10-CM

## 2021-12-07 DIAGNOSIS — L989 Disorder of the skin and subcutaneous tissue, unspecified: Secondary | ICD-10-CM

## 2021-12-07 DIAGNOSIS — R627 Adult failure to thrive: Secondary | ICD-10-CM | POA: Diagnosis not present

## 2021-12-07 DIAGNOSIS — Z01419 Encounter for gynecological examination (general) (routine) without abnormal findings: Secondary | ICD-10-CM | POA: Diagnosis not present

## 2021-12-07 DIAGNOSIS — I499 Cardiac arrhythmia, unspecified: Secondary | ICD-10-CM

## 2021-12-07 NOTE — Progress Notes (Signed)
SUBJECTIVE ? ?HPI ? ?Mary Cabrera is a 26 y.o.-year-old female who presents for an annual gynecological exam, Pap smear, and STI today. She would like her thyroid checked due to a family history of thyroid disorder. She reports fatigue, night sweats, inability to gain weight, and an occasional jittery/fluttery feeling in her chest. She also reports a lesion on her nose that continues to flake and be discolored. She has no other health concerns today. ? ?Medical/Surgical History ?Past Medical History:  ?Diagnosis Date  ? History of Lyme disease 2012 - 2013  ? Nausea & vomiting   ? with pregnancy  ? Pain in a tooth or teeth   ? ?Past Surgical History:  ?Procedure Laterality Date  ? THERAPEUTIC ABORTION  03/2018  ? ? ?Social History ?Lives with mother and 50-year-old son ?Works from home ?Exercise: chasing toddler ?Substances: EtOH 3x/week, daily vaping, denies tobacco and recreational drugs  ? ?Obstetric History ?OB History   ? ? Gravida  ?2  ? Para  ?0  ? Term  ?0  ? Preterm  ?0  ? AB  ?1  ? Living  ?0  ?  ? ? SAB  ?0  ? IAB  ?1  ? Ectopic  ?0  ? Multiple  ?0  ? Live Births  ?0  ?   ?  ?  ?  ? ?GYN/Menstrual History ?Patient's last menstrual period was 10/20/2021 (approximate). ?Regular periods approximately every other month with Nexplanon ?Last Pap: 3 years ago, normal ?Contraception: Nexplanon ? ?Prevention ?Mammogram at 57 ? ?Current Medications ?Outpatient Medications Prior to Visit  ?Medication Sig  ? Prenatal Vit-Fe Fumarate-FA (MULTIVITAMIN-PRENATAL) 27-0.8 MG TABS tablet Take 1 tablet by mouth daily at 12 noon.  ? Prenatal Vit-Fe Fumarate-FA (PRENATAL VITAMINS) 28-0.8 MG TABS Take 1 tablet by mouth daily.  ? ?No facility-administered medications prior to visit.  ?  ? ? ?The pregnancy intention screening data noted above was reviewed. Potential methods of contraception were discussed. The patient elected to proceed with No data recorded.  ? ?ROS ?History obtained from chart review ?General ROS: positive for   - fatigue and night sweats ?negative for - chills or fever ?Psychological ROS: negative for - anxiety or depression ?Ophthalmic ROS: negative for - decreased vision ?Hematological and Lymphatic ROS: negative for - bleeding problems or swollen lymph nodes ?Endocrine ROS: positive for - inability to gain weight ?negative for - breast changes or palpitations ?Breast ROS: negative for breast lumps ?Respiratory ROS: no cough, shortness of breath, or wheezing ?Cardiovascular ROS: no chest pain or dyspnea on exertion ?Gastrointestinal ROS: no abdominal pain, change in bowel habits, or black or bloody stools ?Genito-Urinary ROS: no dysuria, trouble voiding, or hematuria ?Musculoskeletal ROS: negative ?Dermatological ROS: negative - reports lesion on nose ? ?   ? View : No data to display.  ?  ?  ?  ?  ? ?OBJECTIVE ? ?Last Weight  Most recent update: 12/07/2021 10:37 AM  ? ? Weight  ?40.4 kg (89 lb)  ?      ? ?  ?  ?Body mass index is 16.28 kg/m?.  ? ? ?BP 122/83   Pulse (!) 56   Wt 89 lb (40.4 kg)   LMP 10/20/2021 (Approximate)   BMI 16.28 kg/m?  ?General appearance: alert, cooperative, and appears stated age ?Head: Normocephalic, without obvious abnormality, atraumatic ?Eyes: negative findings: lids and lashes normal and conjunctivae and sclerae normal ?Neck: no adenopathy, supple, symmetrical, trachea midline, and thyroid not enlarged, symmetric, no tenderness/mass/nodules ?  Lungs: clear to auscultation bilaterally ?Breasts:  declined ?Heart: irregularly irregular rhythm, S1, S2 normal, and no murmur ?Abdomen: soft, non-tender; bowel sounds normal; no masses,  no organomegaly ?Pelvic: cervix normal in appearance, external genitalia normal, no cervical motion tenderness, rectovaginal septum normal, vagina normal without discharge, and Pap collected ?Extremities: extremities normal, atraumatic, no cyanosis or edema ?Pulses: 2+ and symmetric ?Skin: Skin color, texture, turgor normal. No rashes or lesions  ?Lymph nodes:  Cervical, supraclavicular, and axillary nodes normal. ? ?ASSESSMENT  ?1) Annual exam ?2) Pap due ?3) Irregular heart rate ?4) Skin lesion  ? ?PLAN ?1) Physical exam as noted. STI swabs and blood work collected. Labs: thyroid panel and antibodies, A1C, CBC ?2) Pap collected; f/u based on results ?3) Referral to cardiology ?4) Referral to dermatology for evaluation ? ? ?Lloyd Huger, CNM   ?

## 2021-12-08 ENCOUNTER — Other Ambulatory Visit: Payer: Self-pay | Admitting: Obstetrics

## 2021-12-08 ENCOUNTER — Encounter: Payer: Self-pay | Admitting: Obstetrics

## 2021-12-08 LAB — THYROID ANTIBODIES
Thyroglobulin Antibody: <1 IU/mL (ref 0.0–0.9)
Thyroperoxidase Ab SerPl-aCnc: <9 IU/mL (ref 0–34)

## 2021-12-08 LAB — CERVICOVAGINAL ANCILLARY ONLY
Bacterial Vaginitis (gardnerella): NEGATIVE
Candida Glabrata: NEGATIVE
Candida Vaginitis: NEGATIVE
Chlamydia: NEGATIVE
Comment: NEGATIVE
Comment: NEGATIVE
Comment: NEGATIVE
Comment: NEGATIVE
Comment: NEGATIVE
Comment: NORMAL
Neisseria Gonorrhea: NEGATIVE
Trichomonas: POSITIVE — AB

## 2021-12-08 LAB — CBC WITH DIFFERENTIAL/PLATELET
Basophils Absolute: 0 10*3/uL (ref 0.0–0.2)
Basos: 0 %
EOS (ABSOLUTE): 0 10*3/uL (ref 0.0–0.4)
Eos: 0 %
Hematocrit: 41.3 % (ref 34.0–46.6)
Hemoglobin: 14.3 g/dL (ref 11.1–15.9)
Immature Grans (Abs): 0 10*3/uL (ref 0.0–0.1)
Immature Granulocytes: 0 %
Lymphocytes Absolute: 1.6 10*3/uL (ref 0.7–3.1)
Lymphs: 20 %
MCH: 29.5 pg (ref 26.6–33.0)
MCHC: 34.6 g/dL (ref 31.5–35.7)
MCV: 85 fL (ref 79–97)
Monocytes Absolute: 0.3 10*3/uL (ref 0.1–0.9)
Monocytes: 4 %
Neutrophils Absolute: 6.3 10*3/uL (ref 1.4–7.0)
Neutrophils: 76 %
Platelets: 235 10*3/uL (ref 150–450)
RBC: 4.84 x10E6/uL (ref 3.77–5.28)
RDW: 12.1 % (ref 11.7–15.4)
WBC: 8.3 10*3/uL (ref 3.4–10.8)

## 2021-12-08 LAB — HEMOGLOBIN A1C
Est. average glucose Bld gHb Est-mCnc: 111 mg/dL
Hgb A1c MFr Bld: 5.5 % (ref 4.8–5.6)

## 2021-12-08 LAB — THYROID PANEL WITH TSH
Free Thyroxine Index: 1.8 (ref 1.2–4.9)
T3 Uptake Ratio: 25 % (ref 24–39)
T4, Total: 7.1 ug/dL (ref 4.5–12.0)
TSH: 0.562 u[IU]/mL (ref 0.450–4.500)

## 2021-12-08 LAB — HEPATITIS B SURFACE ANTIGEN: Hepatitis B Surface Ag: NEGATIVE

## 2021-12-08 LAB — HEPATITIS C ANTIBODY: Hep C Virus Ab: NONREACTIVE

## 2021-12-08 LAB — HEPATITIS B SURFACE ANTIBODY,QUALITATIVE: Hep B Surface Ab, Qual: REACTIVE

## 2021-12-08 LAB — HIV ANTIBODY (ROUTINE TESTING W REFLEX): HIV Screen 4th Generation wRfx: NONREACTIVE

## 2021-12-08 LAB — RPR: RPR Ser Ql: NONREACTIVE

## 2021-12-08 MED ORDER — METRONIDAZOLE 500 MG PO TABS: 500.0000 mg | ORAL_TABLET | Freq: Two times a day (BID) | ORAL | 0 refills | Status: DC

## 2021-12-08 NOTE — Progress Notes (Signed)
+   trich. Metronidazole 500 mg PO BID x 7 days sent to pharmacy. Anamari notified via MyChart. ? ?M. Chryl Heck, CNM ?

## 2021-12-09 LAB — CYTOLOGY - PAP: Diagnosis: NEGATIVE

## 2021-12-15 ENCOUNTER — Encounter: Payer: Self-pay | Admitting: Obstetrics

## 2022-01-13 ENCOUNTER — Ambulatory Visit (INDEPENDENT_AMBULATORY_CARE_PROVIDER_SITE_OTHER): Payer: Medicaid Other | Admitting: Cardiology

## 2022-01-13 ENCOUNTER — Ambulatory Visit (INDEPENDENT_AMBULATORY_CARE_PROVIDER_SITE_OTHER): Payer: Medicaid Other

## 2022-01-13 ENCOUNTER — Encounter: Payer: Self-pay | Admitting: Cardiology

## 2022-01-13 VITALS — BP 102/70 | HR 65 | Ht 61.0 in | Wt 90.0 lb

## 2022-01-13 DIAGNOSIS — I493 Ventricular premature depolarization: Secondary | ICD-10-CM | POA: Diagnosis not present

## 2022-01-13 NOTE — Progress Notes (Signed)
Cardiology Office Note:    Date:  01/13/2022   ID:  Mary Cabrera, DOB November 13, 1995, MRN [id number]  PCP:  Pcp, No   [facility] Providers Cardiologist:  None     Referring MD: Glenetta Borg, CNM   No chief complaint on file.   History of Present Illness:    Mary Cabrera is a 26 y.o. female with no significant past medical history who presents due to irregular heartbeats.  Patient saw her primary care provider, heart rate was noted to be irregular on physical exam.  She denies chest pain, palpitations, shortness of breath, dizziness.  She engages in Forensic scientist.  Otherwise has no concerns at this time.  Past Medical History:  Diagnosis Date   History of Lyme disease 2012 - 2013   Nausea & vomiting    with pregnancy   Pain in a tooth or teeth     Past Surgical History:  Procedure Laterality Date   THERAPEUTIC ABORTION  03/2018    Current Medications: No outpatient medications have been marked as taking for the 01/13/22 encounter (Office Visit) with Debbe Odea, MD.     Allergies:   Nickel   Social History   Socioeconomic History   Marital status: Single    Spouse name: Not on file   Number of children: 0   Years of education: 13   Highest education level: Some college, no degree  Occupational History   Not on file  Tobacco Use   Smoking status: Former    Types: Cigarettes    Quit date: 02/12/2019    Years since quitting: 2.9   Smokeless tobacco: Never  Substance and Sexual Activity   Alcohol use: Not Currently    Comment: Passt ETOH use of beer, liquor.   Drug use: Not Currently    Types: Marijuana   Sexual activity: Yes    Birth control/protection: Condom, Pill    Comment: Last ocp used 01/2017.  Other Topics Concern   Not on file  Social History Narrative   Not on file   Social Determinants of Health   Financial Resource Strain: Not on file  Food Insecurity: Not on file  Transportation Needs: Not on file  Physical Activity:  Not on file  Stress: Not on file  Social Connections: Not on file     Family History: The patient's family history includes Breast cancer in her maternal great-grandmother; Heart disease in her maternal grandmother; Hypertension in her father, maternal grandmother, and mother; Thyroid disease in her maternal grandmother and mother.  ROS:   Please see the history of present illness.     All other systems reviewed and are negative.  EKGs/Labs/Other Studies Reviewed:    The following studies were reviewed today:   EKG:  EKG is  ordered today.  The ekg ordered today demonstrates sinus rhythm, frequent PVCs.  Recent Labs: 12/07/2021: Hemoglobin 14.3; Platelets 235; TSH 0.562  Recent Lipid Panel No results found for: CHOL, TRIG, HDL, CHOLHDL, VLDL, LDLCALC, LDLDIRECT   Risk Assessment/Calculations:         Physical Exam:    VS:  BP 102/70   Pulse 65   Ht 5\' 1"  (1.549 m)   Wt 90 lb (40.8 kg)   SpO2 99%   BMI 17.01 kg/m     Wt Readings from Last 3 Encounters:  01/13/22 90 lb (40.8 kg)  12/07/21 89 lb (40.4 kg)  05/05/19 108 lb (49 kg)     GEN:  Well nourished, well developed  in no acute distress HEENT: Normal NECK: No JVD; No carotid bruits CARDIAC: Regular skipped heartbeats. RESPIRATORY:  Clear to auscultation without rales, wheezing or rhonchi  ABDOMEN: Soft, non-tender, non-distended MUSCULOSKELETAL:  No edema; No deformity  SKIN: Warm and dry NEUROLOGIC:  Alert and oriented x 3 PSYCHIATRIC:  Normal affect   ASSESSMENT:    1. Frequent PVCs    PLAN:    In order of problems listed above:  EKG shows frequent PVCs, sinus rhythm.  Place cardiac monitor x1 week to document PVC burden.  Get echocardiogram.  Patient is clinically asymptomatic based on findings on echo, will consider monitoring versus suppression of PVCs.  Follow-up after echo and cardiac monitor      Medication Adjustments/Labs and Tests Ordered: Current medicines are reviewed at length with  the patient today.  Concerns regarding medicines are outlined above.  Orders Placed This Encounter  Procedures   LONG TERM MONITOR (3-14 DAYS)   EKG 12-Lead   ECHOCARDIOGRAM COMPLETE   No orders of the defined types were placed in this encounter.   Patient Instructions  Medication Instructions:   Your physician recommends that you continue on your current medications as directed. Please refer to the Current Medication list given to you today.  *If you need a refill on your cardiac medications before your next appointment, please call your pharmacy*   Lab Work:  None ordered  If you have labs (blood work) drawn today and your tests are completely normal, you will receive your results only by: MyChart Message (if you have MyChart) OR A paper copy in the mail If you have any lab test that is abnormal or we need to change your treatment, we will call you to review the results.   Testing/Procedures:   Your physician has requested that you have an echocardiogram. Echocardiography is a painless test that uses sound waves to create images of your heart. It provides your doctor with information about the size and shape of your heart and how well your heart's chambers and valves are working. This procedure takes approximately one hour. There are no restrictions for this procedure.   2.   Your physician has recommended that you wear a Zio XT monitor for 1 weeks. This will be mailed to your home address in 4-5 business days.   This monitor is a medical device that records the heart's electrical activity. Doctors most often use these monitors to diagnose arrhythmias. Arrhythmias are problems with the speed or rhythm of the heartbeat. The monitor is a small device applied to your chest. You can wear one while you do your normal daily activities. While wearing this monitor if you have any symptoms to push the button and record what you felt. Once you have worn this monitor for the period of time  provider prescribed (Usually 14 days), you will return the monitor device in the postage paid box. Once it is returned they will download the data collected and provide [place] with a report which the provider will then review and we will call you with those results. Important tips:  Avoid showering during the first 24 hours of wearing the monitor. Avoid excessive sweating to help maximize wear time. Do not submerge the device, no hot tubs, and no swimming pools. Keep any lotions or oils away from the patch. After 24 hours you may shower with the patch on. Take brief showers with your back facing the shower head.  Do not remove patch once it has been placed because  that will interrupt data and decrease adhesive wear time. Push the button when you have any symptoms and write down what you were feeling. Once you have completed wearing your monitor, remove and place into box which has postage paid and place in your outgoing mailbox.  If for some reason you have misplaced your box then call our office and we can provide another box and/or mail it off for you.    Follow-Up: [facility], you and your health needs are our priority.  As part of our continuing mission to provide you with exceptional heart care, we have created designated Provider Care Teams.  These Care Teams include your primary Cardiologist (physician) and Advanced Practice Providers (APPs -  Physician Assistants and Nurse Practitioners) who all work together to provide you with the care you need, when you need it.  We recommend signing up for the patient portal called "MyChart".  Sign up information is provided on this After Visit Summary.  MyChart is used to connect with patients for Virtual Visits (Telemedicine).  Patients are able to view lab/test results, encounter notes, upcoming appointments, etc.  Non-urgent messages can be sent to your provider as well.   To learn more about what you can do with MyChart, go to  [email or web address].    Your next appointment:   6-8 week(s)  The format for your next appointment:   In Person  Provider:   You may see Debbe OdeaBrian Agbor-Etang, MD or one of the following Advanced Practice Providers on your designated Care Team:   Nicolasa Duckinghristopher Berge, NP Eula Listenyan Dunn, PA-C Cadence Fransico MichaelFurth, [place]-C    Other Instructions   Important Information About Sugar         Signed, Debbe OdeaBrian Agbor-Etang, MD  01/13/2022 12:30 PM    [facility]

## 2022-01-13 NOTE — Patient Instructions (Signed)
Medication Instructions:   Your physician recommends that you continue on your current medications as directed. Please refer to the Current Medication list given to you today.  *If you need a refill on your cardiac medications before your next appointment, please call your pharmacy*   Lab Work:  None ordered  If you have labs (blood work) drawn today and your tests are completely normal, you will receive your results only by: MyChart Message (if you have MyChart) OR A paper copy in the mail If you have any lab test that is abnormal or we need to change your treatment, we will call you to review the results.   Testing/Procedures:   Your physician has requested that you have an echocardiogram. Echocardiography is a painless test that uses sound waves to create images of your heart. It provides your doctor with information about the size and shape of your heart and how well your heart's chambers and valves are working. This procedure takes approximately one hour. There are no restrictions for this procedure.   2.   Your physician has recommended that you wear a Zio XT monitor for 1 weeks. This will be mailed to your home address in 4-5 business days.   This monitor is a medical device that records the heart's electrical activity. Doctors most often use these monitors to diagnose arrhythmias. Arrhythmias are problems with the speed or rhythm of the heartbeat. The monitor is a small device applied to your chest. You can wear one while you do your normal daily activities. While wearing this monitor if you have any symptoms to push the button and record what you felt. Once you have worn this monitor for the period of time provider prescribed (Usually 14 days), you will return the monitor device in the postage paid box. Once it is returned they will download the data collected and provide [place] with a report which the provider will then review and we will call you with those results. Important  tips:  Avoid showering during the first 24 hours of wearing the monitor. Avoid excessive sweating to help maximize wear time. Do not submerge the device, no hot tubs, and no swimming pools. Keep any lotions or oils away from the patch. After 24 hours you may shower with the patch on. Take brief showers with your back facing the shower head.  Do not remove patch once it has been placed because that will interrupt data and decrease adhesive wear time. Push the button when you have any symptoms and write down what you were feeling. Once you have completed wearing your monitor, remove and place into box which has postage paid and place in your outgoing mailbox.  If for some reason you have misplaced your box then call our office and we can provide another box and/or mail it off for you.    Follow-Up: At [facility], you and your health needs are our priority.  As part of our continuing mission to provide you with exceptional heart care, we have created designated Provider Care Teams.  These Care Teams include your primary Cardiologist (physician) and Advanced Practice Providers (APPs -  Physician Assistants and Nurse Practitioners) who all work together to provide you with the care you need, when you need it.  We recommend signing up for the patient portal called "MyChart".  Sign up information is provided on this After Visit Summary.  MyChart is used to connect with patients for Virtual Visits (Telemedicine).  Patients are able to view lab/test results,  encounter notes, upcoming appointments, etc.  Non-urgent messages can be sent to your provider as well.   To learn more about what you can do with MyChart, go to [email or web address].    Your next appointment:   6-8 week(s)  The format for your next appointment:   In Person  Provider:   You may see Debbe Odea, MD or one of the following Advanced Practice Providers on your designated Care Team:   Nicolasa Ducking, NP Eula Listen,  PA-C Cadence Fransico Michael, [place]    Other Instructions   Important Information About Sugar

## 2022-01-17 DIAGNOSIS — I493 Ventricular premature depolarization: Secondary | ICD-10-CM

## 2022-02-03 ENCOUNTER — Ambulatory Visit (INDEPENDENT_AMBULATORY_CARE_PROVIDER_SITE_OTHER): Payer: Medicaid Other

## 2022-02-03 ENCOUNTER — Telehealth: Payer: Self-pay | Admitting: Cardiology

## 2022-02-03 DIAGNOSIS — I493 Ventricular premature depolarization: Secondary | ICD-10-CM

## 2022-02-03 LAB — ECHOCARDIOGRAM COMPLETE
Area-P 1/2: 3.54 cm2
S' Lateral: 2.6 cm

## 2022-02-06 NOTE — Telephone Encounter (Signed)
Attempted to call patient and was unable to leave a VM, received a message that the VM box was full.

## 2022-02-07 ENCOUNTER — Telehealth: Payer: Self-pay | Admitting: Cardiology

## 2022-02-07 NOTE — Telephone Encounter (Signed)
I spoke with the patient. She advised that the mail man did not pick up her monitor for few days as he mail box is brick and doesn't have a flag. She did take this to the post office yesterday to mail it back.  She is currently scheduled for an appointment on 02/10/22 with Dr. Azucena Cecil. I have advised the patient that we will need to reschedule her appointment about 2 weeks out.  The patient advised she is moving on 02/11/22 and is requesting a virtual visit. I have advised the patient I can move her to 02/27/22 at 10:20 am. She is aware I will send a tele-health consent for her to read/ review and just message [place] back "I agree."   She is also advised that if her monitor results prior to her 02/27/22 appointment and is normal, so may not need to keep her 7/17 appointment.  The patient voices understanding and is agreeable.

## 2022-02-07 NOTE — Telephone Encounter (Signed)
Patient called to say that her heart montior has been pick up yet. Calling to see if she needs to still keep her appt on 6/30. Please advise

## 2022-02-10 ENCOUNTER — Ambulatory Visit: Payer: Medicaid Other | Admitting: Cardiology

## 2022-02-24 NOTE — Telephone Encounter (Signed)
Called patient and informed her that we can cancel her FU as the result note from her monitor advises serial monitoring for PVC's but nothing is urgent at this time. Once she establishes care elsewhere she will request records from out office.  Patient verbalized understanding and agreed with plan.

## 2022-02-27 ENCOUNTER — Telehealth: Payer: Medicaid Other | Admitting: Cardiology

## 2022-06-07 ENCOUNTER — Ambulatory Visit: Payer: Medicaid Other | Admitting: Dermatology

## 2022-12-13 ENCOUNTER — Encounter: Payer: Medicaid Other | Admitting: Obstetrics

## 2022-12-14 ENCOUNTER — Ambulatory Visit: Payer: Medicaid Other | Admitting: Obstetrics

## 2023-03-16 ENCOUNTER — Encounter: Payer: Self-pay | Admitting: Obstetrics

## 2023-03-16 ENCOUNTER — Ambulatory Visit (INDEPENDENT_AMBULATORY_CARE_PROVIDER_SITE_OTHER): Payer: Medicaid Other | Admitting: Obstetrics

## 2023-03-16 VITALS — BP 119/86 | HR 89 | Ht 61.0 in | Wt 90.0 lb

## 2023-03-16 DIAGNOSIS — Z3046 Encounter for surveillance of implantable subdermal contraceptive: Secondary | ICD-10-CM

## 2023-03-16 DIAGNOSIS — Z30013 Encounter for initial prescription of injectable contraceptive: Secondary | ICD-10-CM

## 2023-03-16 MED ORDER — MEDROXYPROGESTERONE ACETATE 150 MG/ML IM SUSY
150.0000 mg | PREFILLED_SYRINGE | Freq: Once | INTRAMUSCULAR | Status: AC
Start: 2023-03-16 — End: 2023-03-16
  Administered 2023-03-16: 150 mg via INTRAMUSCULAR

## 2023-03-16 MED ORDER — SERTRALINE HCL 25 MG PO TABS: 50.0000 mg | ORAL_TABLET | Freq: Every day | ORAL | 2 refills | Status: DC

## 2023-03-16 NOTE — Progress Notes (Signed)
NEXPLANON REMOVAL  SUBJECTIVE Mary Cabrera is a 27 y.o. G2P0010 who presents today for removal of her Nexplanon. Her Nexplanon was placed in March or April. She reports that she has had shooting pain since the placement. A few days after it was placed, she asked to have it replaced, but their provider said it did not need to be replaced. She would like to have it removed today and will start on Depo. Kessler also reports that her mood has been low and she has been struggling with increasing anxiety lately. She is already doing many things including meditation to try to alleviate her anxiety and she would like to talk about starting medication.   OBJECTIVE Vitals:   03/16/23 1042  BP: 119/86  Pulse: 89    Procedure Note Consent was obtained before beginning this procedure. The Nexplanon was palpated and the surrounding skin was prepped with iodine in sterile fashion. Adequate anesthesia was achieved with subdermal injection of 1% lidocaine. A skin incision was made over the distal aspect of the device. The capsule was lysed sharply and the device was removed with a hemostat. Hemostasis was achieved. The incision site was closed with with a steristrip and a pressure dressing was applied. Lynann tolerated the procedure well.  Standard post-procedure care and precautions were reviewed. Jenilee verbalized understanding. She received a Depo injection following the procedure.  Assessment/Plan Discussed options for medication for anxiety. Kelsha would like to try Zoloft. Instructions and warning sign given. Rx sent to pharmacy on file.  Follow up in 12 weeks for Depo injection and mood check.  Guadlupe Spanish, CNM

## 2023-04-04 ENCOUNTER — Other Ambulatory Visit (HOSPITAL_COMMUNITY)
Admission: RE | Admit: 2023-04-04 | Discharge: 2023-04-04 | Disposition: A | Discharge status: Home / Self Care | Payer: Medicaid Other | Source: Ambulatory Visit | Attending: Obstetrics | Admitting: Obstetrics

## 2023-04-04 ENCOUNTER — Ambulatory Visit (INDEPENDENT_AMBULATORY_CARE_PROVIDER_SITE_OTHER): Payer: Medicaid Other | Admitting: Obstetrics

## 2023-04-04 ENCOUNTER — Encounter: Payer: Self-pay | Admitting: Obstetrics

## 2023-04-04 VITALS — BP 110/74 | HR 84 | Ht 61.0 in | Wt 94.0 lb

## 2023-04-04 DIAGNOSIS — Z01419 Encounter for gynecological examination (general) (routine) without abnormal findings: Secondary | ICD-10-CM | POA: Insufficient documentation

## 2023-04-04 DIAGNOSIS — Z113 Encounter for screening for infections with a predominantly sexual mode of transmission: Secondary | ICD-10-CM | POA: Diagnosis present

## 2023-04-04 NOTE — Addendum Note (Signed)
Addended by: Loney Laurence on: 04/04/2023 02:17 PM   Modules accepted: Orders

## 2023-04-04 NOTE — Progress Notes (Signed)
ANNUAL GYNECOLOGICAL EXAM  SUBJECTIVE  HPI  Mary Cabrera is a 27 y.o.-year-old G2P1001 who presents for an annual gynecological exam today.  She denies pelvic pain, dyspareunia, abnormal vaginal bleeding or discharge, and UTI symptoms. She has a tick bite that is continuing to itch on her leg. She has started taking Zoloft and has been feeling calmer and less anxious. She plans to continue to titrate her dose up to 50 mg. She has had minimal side effects so far. She is not currently sexually active. She would like routine STI testing today.  Medical/Surgical History Past Medical History:  Diagnosis Date   History of Lyme disease 2012 - 2013   Nausea & vomiting    with pregnancy   Pain in a tooth or teeth    Past Surgical History:  Procedure Laterality Date   THERAPEUTIC ABORTION  03/2018    Social History Lives with son, aunt, and uncle. Feels safe there Work: just moved form [employer] - looking for work Exercise: walking, chasing toddler Substances: Occasional EtOH; denies tobacco, vape, and recreational drugs  Obstetric History OB History     Gravida  2   Para  0   Term  0   Preterm  0   AB  1   Living  0      SAB  0   IAB  1   Ectopic  0   Multiple  0   Live Births  1            GYN/Menstrual History Patient's last menstrual period was 03/15/2023. Last Pap: 12/07/21 - NILM Contraception:  Prevention Endorses regular dental and eye exams. Looking for a new dentist in the area. Mammogram: at 39 Colonoscopy: at 44 Flu shot/vaccines: completed HPV series  Current Medications Outpatient Medications Prior to Visit  Medication Sig   sertraline (ZOLOFT) 25 MG tablet Take 2 tablets (50 mg total) by mouth daily. Take half a tab at bedtime for one to two weeks, then increase to one tab at bedtime. Continue for 1-2 weeks then increase to 1.5 tabs. May increase up to 2 tabs at bedtime.   No facility-administered medications prior to visit.       The pregnancy intention screening data noted above was reviewed. Potential methods of contraception were discussed. The patient elected to proceed with Depo. ROS Constitutional: Denied constitutional symptoms, night sweats, recent illness, fatigue, fever, insomnia and weight loss.  Eyes: Denied eye symptoms, eye pain, photophobia, vision change and visual disturbance.  Ears/Nose/Throat/Neck: Denied ear, nose, throat or neck symptoms, hearing loss, nasal discharge, sinus congestion and sore throat.  Cardiovascular: Denied cardiovascular symptoms, arrhythmia, chest pain/pressure, edema, exercise intolerance, orthopnea and palpitations.  Respiratory: Denied pulmonary symptoms, asthma, pleuritic pain, productive sputum, cough, dyspnea and wheezing.  Gastrointestinal: Denied, gastro-esophageal reflux, melena, nausea and vomiting.  Genitourinary: Denied genitourinary symptoms including symptomatic vaginal discharge, pelvic relaxation issues, and urinary complaints.  Musculoskeletal: Denied musculoskeletal symptoms, stiffness, swelling, muscle weakness and myalgia.  Dermatologic: Denied dermatology symptoms, rash and scar.  Neurologic: Denied neurology symptoms, dizziness, headache, neck pain and syncope.  Psychiatric: Denied psychiatric symptoms, anxiety and depression.  Endocrine: Denied endocrine symptoms including hot flashes and night sweats.    OBJECTIVE  BP 110/74   Pulse 84   Ht 5\' 1"  (1.549 m)   Wt 94 lb (42.6 kg)   LMP 03/15/2023   BMI 17.76 kg/m    Physical examination General NAD, Conversant  HEENT Atraumatic; Op clear with mmm.  Normo-cephalic.  Pupils reactive. Anicteric sclerae  Thyroid/Neck Smooth without nodularity or enlargement. Normal ROM.  Neck Supple.  Skin No rashes, lesions or ulceration. Normal palpated skin turgor. No nodularity. Small papule below buttock; no warmth, erythema, or tenderness  Breasts: No masses or discharge.  Symmetric.  No axillary adenopathy.   Lungs: Clear to auscultation.No rales or wheezes. Normal Respiratory effort, no retractions.  Heart: NSR.  No murmurs or rubs appreciated. No peripheral edema  Abdomen: Soft.  Non-tender.  No masses.  No HSM. No hernia  Extremities: Moves all appropriately.  Normal ROM for age. No lymphadenopathy.  Neuro: Oriented to PPT.  Normal mood. Normal affect.     Pelvic: Declined    ASSESSMENT  1) Annual exam  PLAN 1) Physical exam as noted. Discussed healthy lifestyle choices and preventive care. STI swabs and blood collected. Labs: TSH/T4, CBC, A1C, CMP, lipid profile.  2) Return in 10 weeks for next Depo shot and mood check  Return in one year for annual exam or as needed for concerns.   Guadlupe Spanish, CNM

## 2023-04-05 LAB — CBC
Hematocrit: 42.5 % (ref 34.0–46.6)
Hemoglobin: 14.7 g/dL (ref 11.1–15.9)
MCH: 30.2 pg (ref 26.6–33.0)
MCHC: 34.6 g/dL (ref 31.5–35.7)
MCV: 87 fL (ref 79–97)
Platelets: 235 10*3/uL (ref 150–450)
RBC: 4.86 x10E6/uL (ref 3.77–5.28)
RDW: 11.7 % (ref 11.7–15.4)
WBC: 6.2 10*3/uL (ref 3.4–10.8)

## 2023-04-05 LAB — COMPREHENSIVE METABOLIC PANEL
ALT: 13 IU/L (ref 0–32)
AST: 22 IU/L (ref 0–40)
Albumin: 4.5 g/dL (ref 4.0–5.0)
Alkaline Phosphatase: 63 IU/L (ref 44–121)
BUN/Creatinine Ratio: 14 (ref 9–23)
BUN: 11 mg/dL (ref 6–20)
Bilirubin Total: 0.5 mg/dL (ref 0.0–1.2)
CO2: 23 mmol/L (ref 20–29)
Calcium: 9.5 mg/dL (ref 8.7–10.2)
Chloride: 102 mmol/L (ref 96–106)
Creatinine, Ser: 0.81 mg/dL (ref 0.57–1.00)
Globulin, Total: 2.3 g/dL (ref 1.5–4.5)
Glucose: 47 mg/dL — ABNORMAL LOW (ref 70–99)
Potassium: 4.3 mmol/L (ref 3.5–5.2)
Sodium: 138 mmol/L (ref 134–144)
Total Protein: 6.8 g/dL (ref 6.0–8.5)
eGFR: 101 mL/min/{1.73_m2} (ref >59)

## 2023-04-05 LAB — TSH+FREE T4
Free T4: 1.33 ng/dL (ref 0.82–1.77)
TSH: 0.747 u[IU]/mL (ref 0.450–4.500)

## 2023-04-05 LAB — HEPATITIS B SURFACE ANTIBODY,QUALITATIVE: Hep B Surface Ab, Qual: NONREACTIVE

## 2023-04-05 LAB — HIV ANTIBODY (ROUTINE TESTING W REFLEX): HIV Screen 4th Generation wRfx: NONREACTIVE

## 2023-04-05 LAB — HEPATITIS C ANTIBODY: Hep C Virus Ab: NONREACTIVE

## 2023-04-05 LAB — LIPID PANEL
Chol/HDL Ratio: 1.8 ratio (ref 0.0–4.4)
Cholesterol, Total: 172 mg/dL (ref 100–199)
HDL: 95 mg/dL (ref >39)
LDL Chol Calc (NIH): 68 mg/dL (ref 0–99)
Triglycerides: 44 mg/dL (ref 0–149)
VLDL Cholesterol Cal: 9 mg/dL (ref 5–40)

## 2023-04-05 LAB — HEPATITIS B SURFACE ANTIGEN: Hepatitis B Surface Ag: NEGATIVE

## 2023-04-05 LAB — HEMOGLOBIN A1C
Est. average glucose Bld gHb Est-mCnc: 100 mg/dL
Hgb A1c MFr Bld: 5.1 % (ref 4.8–5.6)

## 2023-04-05 LAB — RPR: RPR Ser Ql: NONREACTIVE

## 2023-04-06 LAB — CERVICOVAGINAL ANCILLARY ONLY
Bacterial Vaginitis (gardnerella): NEGATIVE
Candida Glabrata: NEGATIVE
Candida Vaginitis: NEGATIVE
Chlamydia: NEGATIVE
Comment: NEGATIVE
Comment: NEGATIVE
Comment: NEGATIVE
Comment: NEGATIVE
Comment: NEGATIVE
Comment: NORMAL
Neisseria Gonorrhea: NEGATIVE
Trichomonas: NEGATIVE

## 2023-04-07 ENCOUNTER — Encounter: Payer: Self-pay | Admitting: Obstetrics

## 2023-04-27 ENCOUNTER — Ambulatory Visit (INDEPENDENT_AMBULATORY_CARE_PROVIDER_SITE_OTHER): Payer: Medicaid Other | Admitting: Obstetrics

## 2023-04-27 ENCOUNTER — Encounter: Payer: Self-pay | Admitting: Obstetrics

## 2023-04-27 VITALS — BP 145/91 | HR 121 | Temp 98.0°F | Ht 61.0 in | Wt 91.0 lb

## 2023-04-27 DIAGNOSIS — F419 Anxiety disorder, unspecified: Secondary | ICD-10-CM | POA: Insufficient documentation

## 2023-04-27 DIAGNOSIS — T1490XD Injury, unspecified, subsequent encounter: Secondary | ICD-10-CM | POA: Diagnosis not present

## 2023-04-27 DIAGNOSIS — R61 Generalized hyperhidrosis: Secondary | ICD-10-CM
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
## 2023-06-13 ENCOUNTER — Encounter: Payer: Self-pay | Admitting: Obstetrics and Gynecology

## 2023-06-13 ENCOUNTER — Ambulatory Visit: Payer: Medicaid Other

## 2023-06-13 ENCOUNTER — Ambulatory Visit: Payer: Medicaid Other | Admitting: Obstetrics and Gynecology

## 2023-06-13 VITALS — BP 134/75 | HR 94 | Ht 61.0 in | Wt 91.9 lb

## 2023-06-13 VITALS — BP 118/79 | HR 59 | Resp 16 | Ht 61.0 in | Wt 91.0 lb

## 2023-06-13 DIAGNOSIS — F32A Depression, unspecified: Secondary | ICD-10-CM | POA: Diagnosis not present

## 2023-06-13 DIAGNOSIS — F419 Anxiety disorder, unspecified: Secondary | ICD-10-CM

## 2023-06-13 DIAGNOSIS — Z3042 Encounter for surveillance of injectable contraceptive: Secondary | ICD-10-CM

## 2023-06-13 MED ORDER — DEPO-SUBQ PROVERA 104 104 MG/0.65ML ~~LOC~~ SUSY: 104.0000 mg | PREFILLED_SYRINGE | SUBCUTANEOUS | 3 refills | Status: DC

## 2023-06-13 MED ORDER — MIRTAZAPINE 15 MG PO TABS: 15.0000 mg | ORAL_TABLET | Freq: Every day | ORAL | 0 refills | Status: DC

## 2023-06-13 MED ORDER — HYDROXYZINE HCL 25 MG PO TABS: 25.0000 mg | ORAL_TABLET | Freq: Three times a day (TID) | ORAL | 2 refills | Status: DC | PRN

## 2023-06-13 NOTE — Progress Notes (Signed)
    NURSE VISIT NOTE  Subjective:    Patient ID: Mary Cabrera, female    DOB: 06/06/1996, 27 y.o.   MRN: [id number]  HPI  Patient is a 27 y.o. G12P0010 female who presents for depo provera injection.   Objective:    There were no vitals taken for this visit.  Last Annual: 04/04/23. Last pap: 12/07/21. Last Depo-Provera: 03/16/23. Side Effects if any: none. Serum HCG indicated? No . Depo-Provera 150 mg IM not given by: Rocco Serene, LPN. Site: Right Deltoid Wasted: Medroxyprogesterone: NDC [id number], Lot [id number], Exp 11/11/2024, 150 mg  Arm was prepped with alcohol. Injection was drawn and air removed from syringe/needle. Needle inserted in arm. Resistance felt when applying pressure to plunger. Removed from arm, changed needle, pressed plunger to remove air. With patient permission needle inserted in arm again. Same thing occurred. Plunger would not deploy. Removed needle from arm. Called another clinical staff member in room to advise what had happened. Show syringe while applying pressure and medication not coming out of needle. Patient was seen by Dr. Valentino Saxon to discuss other concerns she has today. See Dr. Oretha Milch note.  Assessment:   1. Encounter for surveillance of injectable contraceptive      Plan:   See Dr Valentino Saxon visit note.    Rocco Serene, LPN

## 2023-06-13 NOTE — Patient Instructions (Addendum)
Please see the following website for a list of options for Psychologists:  [email or web address].com/us

## 2023-06-13 NOTE — Progress Notes (Signed)
GYNECOLOGY PROGRESS NOTE  Subjective:    Patient ID: Mary Cabrera, female    DOB: 1996-06-25, 27 y.o.   MRN: [id number]  HPI  Patient is a 27 y.o. G28P0010 female who initially presented for Depo injection.  Patient also with significant anxiety and depression, and panic attacks, tearful during visit. Was initiated on Zoloft at her last visit with Guadlupe Spanish, CNM. Notes she was placed on 25 mg, and then has increased to 50 mg after 2 weeks. States that the first few weeks she started feeling somewhat better, but since increasing the dose, has felt like her symptoms are still fairly significant. Feels like she is under a lot of stress.  Notes that she discontinued alcohol use several months ago, however wakes up lately feeling as though she has been drinking. Has difficulty sleeping at night. Reports a friend is currently on Remron, wonders if this will be helpful for her.   The following portions of the patient's history were reviewed and updated as appropriate: allergies, current medications, past family history, past medical history, past social history, past surgical history, and problem list.  Review of Systems Pertinent items noted in HPI and remainder of comprehensive ROS otherwise negative.   Objective:   Blood pressure 118/79, pulse (!) 59, resp. rate 16, height 5\' 1"  (1.549 m), weight 91 lb (41.3 kg). Body mass index is 17.19 kg/m. General appearance: alert, mild distress, and tearful during visit.  Psych: depressed but anxious mood, normal speech, normal thought content.       06/13/2023    5:08 PM  GAD 7 : Generalized Anxiety Score  Nervous, Anxious, on Edge 2  Control/stop worrying 2  Worry too much - different things 2  Trouble relaxing 2  Restless 2  Easily annoyed or irritable 2  Afraid - awful might happen 2  Total GAD 7 Score 14  Anxiety Difficulty Very difficult        06/13/2023    5:07 PM  Depression screen PHQ 2/9  Decreased Interest 2  Down,  Depressed, Hopeless 2  PHQ - 2 Score 4  Altered sleeping 2  Tired, decreased energy 2  Change in appetite 2  Feeling bad or failure about yourself  2  Trouble concentrating 3  Moving slowly or fidgety/restless 0  Suicidal thoughts 0  PHQ-9 Score 15  Difficult doing work/chores Very difficult    Assessment:   1. Anxiety and depression   2. Depo-Provera contraceptive status      Plan:   1. Anxiety and depression - Lengthy discussion had on patient's symptoms. Elevated PHQ-9 and Gad-7 today. Currently on Zoloft. Advised on options of continuing to increase dosing, changing to a different medication, and/or supplementation with an anxiolytic to help better manage symptoms. Patient notes she would like to try switching to a different medication. Notes shw would like to try Remron, which her fried is on. Discussed risks/benefits. Advised to decrease Zoloft back down to 25 mg from 50 mg while initiating Remron, then after 1-2 weeks can discontinue. Will prescribe Hydroxyzine for prn episodes of anxiety, as Remron more geared towards depression management. Also discussed option referral for therapy, patient notes she would be interested in this. Given information for PsychologyToday Website to be able to select a provider.  Will f/u in 4 weeks to reassess symptoms. Can do televisit as patient notes white-coat syndrome with heightened anxiety at [employer].   2. Depo-Provera contraceptive status -  Patient to receive Depo Provera for  contraception, however unable to receive injection today due to difficulty with needles, also very anxious about the shot. Discussed other alternatives such as subQ Provera (where she can administer herself or have family member administer, to be away from the medical office due to her anxiety). Patient notes she is ok with this Will prescribe.     Return to clinic for any scheduled appointments or for any gynecologic concerns as needed. RTC in 4 weeks.    A  total of 25 minutes were spent face-to-face with the patient during this encounter and over half of that time involved counseling and coordination of care.   Hildred Laser, MD [facility] at [facility]

## 2023-07-03 ENCOUNTER — Encounter: Payer: Self-pay | Admitting: Obstetrics and Gynecology

## 2023-07-03 ENCOUNTER — Ambulatory Visit: Payer: Medicaid Other | Admitting: Obstetrics and Gynecology

## 2023-07-03 VITALS — Ht 61.0 in | Wt 103.0 lb

## 2023-07-03 DIAGNOSIS — R61 Generalized hyperhidrosis: Secondary | ICD-10-CM | POA: Diagnosis not present

## 2023-07-03 DIAGNOSIS — F32A Depression, unspecified: Secondary | ICD-10-CM | POA: Diagnosis not present

## 2023-07-03 DIAGNOSIS — F419 Anxiety disorder, unspecified: Secondary | ICD-10-CM | POA: Diagnosis not present

## 2023-07-03 DIAGNOSIS — Z3042 Encounter for surveillance of injectable contraceptive: Secondary | ICD-10-CM

## 2023-07-03 MED ORDER — HYDROXYZINE HCL 25 MG PO TABS: 25.0000 mg | ORAL_TABLET | Freq: Three times a day (TID) | ORAL | 6 refills | Status: DC | PRN

## 2023-07-03 MED ORDER — MIRTAZAPINE 15 MG PO TABS: 15.0000 mg | ORAL_TABLET | Freq: Every day | ORAL | 3 refills | Status: DC

## 2023-07-03 NOTE — Progress Notes (Signed)
Virtual Gynecology Visit via Telephone Note  I connected with Mary Cabrera on 07/03/23 at  4:15 PM EST by telephone and verified that I am speaking with the correct person using two identifiers.  Location: Patient: Home Provider: Office    I discussed the limitations, risks, security and privacy concerns of performing an evaluation and management service by telephone and the availability of in person appointments. I also discussed with the patient that there may be a patient responsible charge related to this service. The patient expressed understanding and agreed to proceed.   Subjective:     Mary Cabrera is a 27 y.o. G102P1011 female who presents for follow up of anxiety and depression disorder.  She was changed from Zoloft to Remron and also prescribed Hydroxyzine for prn symptoms last visit. She denies current suicidal and homicidal ideation. She denies major side effects from the treatment. Notes good results from use of the Remron, states her appetite has increased and her overall mood has improved.  Is also sleeping better. Additionally does feel some mild improvement of her anxiety with use of the hydroxyzine, taking intermittently especially on days when she knows her stress levels may be higher (I.e. when dealing with estranged FOB).  Has not heard back from referral for counseling services.   Patient was also prescribed subQ Depo Provera, however notes that she has not initiated this medication yet as it was not available at her pharmacy at the time of prescribing. Has not yet checked back with her pharmacy to see if medication has become available.    Does desires to discuss her night sweats. Notes that this has been occurring even prior to initiation of any medications.  Notes that it was happening several   The following portions of the patient's history were reviewed and updated as appropriate: allergies, current medications, past family history, past medical history, past  social history, past surgical history, and problem list.    Objective:    Ht 5\' 1"  (1.549 m)   Wt 103 lb (46.7 kg)   BMI 19.46 kg/m   General:  alert and no distress  Affect/Behavior:  normal speech pattern and content and normal thought patterns. Negative for agitation.       Assessment:   Anxiety and depression   Contraception management  Plan:   Anxiety and depression - Medications: Remron and Hydroxyzine. Refills given on medications. - Instructed patient to contact office or on-call physician promptly should condition worsen or any new symptoms appear and provided on-call telephone numbers. IF THE PATIENT HAS ANY SUICIDAL OR HOMICIDAL IDEATIONS, CALL THE OFFICE, DISCUSS WITH A SUPPORT MEMBER, OR GO TO THE ER IMMEDIATELY. Patient was agreeable with this plan. - Will f/u with referral for outpatient counseling.    2.   Chronic night sweats - Patient reporting night sweats, occurred prior to initiation of medications. Notes that over the past month they seemed to have increased. Has had workup prior to this with evaluation of thyroid hormones.  Denies recent infections, and no concerns at this time for other causes such as malignancies or other autoimmune or systemic inflammatory processes. Will order hormonal assessment as patient has been off her Depo Provera for the past month.   3.  Contraception management (Depo Provera)  - Patient unable to initiate new prescription due to unavailability at pharmacy. Advised to f/u with pharmacy, and if still unavailable, can prescribe to a different pharmacy.   Return to clinic for any scheduled appointments or for  any gynecologic concerns as needed.  Call or return to clinic prn if these symptoms worsen or fail to improve as anticipated.    Follow Up Instructions:    I discussed the assessment and treatment plan with the patient. The patient was provided an opportunity to ask questions and all were answered. The patient agreed with the  plan and demonstrated an understanding of the instructions.   The patient was advised to call back or seek an in-person evaluation if the symptoms worsen or if the condition fails to improve as anticipated.  I provided 11 minutes of non-face-to-face time during this encounter.   Hildred Laser, MD [facility] at [facility]

## 2023-07-11 ENCOUNTER — Telehealth: Payer: Medicaid Other | Admitting: Obstetrics and Gynecology

## 2023-08-09 ENCOUNTER — Other Ambulatory Visit: Payer: Self-pay | Admitting: Obstetrics and Gynecology

## 2023-08-24 ENCOUNTER — Other Ambulatory Visit: Payer: Self-pay | Admitting: Obstetrics and Gynecology

## 2023-08-24 ENCOUNTER — Other Ambulatory Visit: Payer: Self-pay

## 2023-09-18 ENCOUNTER — Encounter: Payer: Self-pay | Admitting: Obstetrics

## 2023-09-18 ENCOUNTER — Ambulatory Visit (INDEPENDENT_AMBULATORY_CARE_PROVIDER_SITE_OTHER): Payer: Medicaid Other | Admitting: Obstetrics

## 2023-09-18 VITALS — BP 144/120 | HR 94 | Ht 61.0 in | Wt 99.5 lb

## 2023-09-18 DIAGNOSIS — Z3202 Encounter for pregnancy test, result negative: Secondary | ICD-10-CM | POA: Diagnosis not present

## 2023-09-18 DIAGNOSIS — R030 Elevated blood-pressure reading, without diagnosis of hypertension: Secondary | ICD-10-CM | POA: Diagnosis not present

## 2023-09-18 DIAGNOSIS — Z30011 Encounter for initial prescription of contraceptive pills: Secondary | ICD-10-CM | POA: Diagnosis not present

## 2023-09-18 LAB — POCT URINE PREGNANCY: Preg Test, Ur: NEGATIVE

## 2023-09-18 MED ORDER — LEVONORGEST-ETH ESTRAD 91-DAY 0.15-0.03 &0.01 MG PO TABS
1.0000 | ORAL_TABLET | Freq: Every day | ORAL | 4 refills | Status: DC
Start: 2023-09-18 — End: 2024-04-18

## 2023-09-18 NOTE — Progress Notes (Signed)
    GYNECOLOGY PROGRESS NOTE  Subjective:  PCP: Pcp, No  Patient ID: ALLEAN MONTFORT, female    DOB: [birth year], 28 y.o.   MRN: [id number]  HPI  Patient is a 28 y.o. G76P1011 female who presents for [facility] consult and wants to go back on the pill. Most recently had DMPA, had 1st injection Aug '24, chose this to gain weight and when she didn't, she would like to go back to OCPs. Has previously also had Nexplanon, but didn't like. Would like to suppress periods with OCPs if able.  Of note, BP 143/88 today, denies HA, vision changes, palpitations, dizziness/lightheadedness, or weakness. No prior dx of HTN.   The following portions of the patient's history were reviewed and updated as appropriate: allergies, current medications, past family history, past medical history, past social history, past surgical history, and problem list.  Review of Systems Pertinent items are noted in HPI.   Objective:   Blood pressure (!) 144/120, pulse 94, height 5' 1 (1.549 m), weight 99 lb 8 oz (45.1 kg), last menstrual period 09/12/2023. Body mass index is 18.8 kg/m.  Physical Exam Constitutional:      Appearance: Normal appearance. She is normal weight.  HENT:     Head: Normocephalic and atraumatic.  Eyes:     Extraocular Movements: Extraocular movements intact.  Pulmonary:     Effort: Pulmonary effort is normal.  Skin:    General: Skin is warm and dry.  Neurological:     General: No focal deficit present.     Mental Status: She is alert.  Psychiatric:        Mood and Affect: Mood normal.     Component Ref Range & Units (hover) 14:02  Preg Test, Ur Negative    Assessment/Plan:   1. Oral contraception initial prescription   2. Elevated BP without diagnosis of hypertension     Oral contraception:  -Urine HCG negative today. -Start oral contraceptives as prescribed: Seasonique, Q2mos periods -Cardiovascular and other risks discussed, aware that smoking while on OCPs increases the risk of  blood clots.  -SEs discussed, including BTB, N&V, weight changes, increased moodiness. If symptoms are severe, or mild SEs do not improve after 3 mos, call or RTC to discuss options.  -Refrain from intercourse or use barrier contraception x 7 days after starting OCPs.  -Aware that OCPs protect against pregnancy only, not STDs. -Follow up in 12 months, sooner prn.  Elevated BP without dx of HTN -Initial BP 143/88, repeat 144/120 and pt is asymptomatic. May have white coat induced hypertension, but recommend she establish with a PCP for further evaluation.    Estil Mangle, DO [facility]

## 2023-09-21 ENCOUNTER — Ambulatory Visit: Payer: Medicaid Other | Admitting: Obstetrics

## 2023-12-14 ENCOUNTER — Other Ambulatory Visit: Payer: Self-pay

## 2023-12-14 DIAGNOSIS — Z30011 Encounter for initial prescription of contraceptive pills: Secondary | ICD-10-CM

## 2024-04-18 ENCOUNTER — Ambulatory Visit (INDEPENDENT_AMBULATORY_CARE_PROVIDER_SITE_OTHER): Admitting: Obstetrics

## 2024-04-18 ENCOUNTER — Other Ambulatory Visit (HOSPITAL_COMMUNITY)
Admission: RE | Admit: 2024-04-18 | Discharge: 2024-04-18 | Disposition: A | Discharge status: Home / Self Care | Source: Ambulatory Visit | Attending: Obstetrics | Admitting: Obstetrics

## 2024-04-18 ENCOUNTER — Encounter: Payer: Self-pay | Admitting: Obstetrics

## 2024-04-18 VITALS — BP 116/84 | HR 78 | Ht 61.0 in | Wt 96.4 lb

## 2024-04-18 DIAGNOSIS — Z01419 Encounter for gynecological examination (general) (routine) without abnormal findings: Secondary | ICD-10-CM

## 2024-04-18 DIAGNOSIS — Z01411 Encounter for gynecological examination (general) (routine) with abnormal findings: Secondary | ICD-10-CM | POA: Diagnosis not present

## 2024-04-18 DIAGNOSIS — Z113 Encounter for screening for infections with a predominantly sexual mode of transmission: Secondary | ICD-10-CM

## 2024-04-18 DIAGNOSIS — R229 Localized swelling, mass and lump, unspecified: Secondary | ICD-10-CM | POA: Diagnosis not present

## 2024-04-18 DIAGNOSIS — Z1151 Encounter for screening for human papillomavirus (HPV): Secondary | ICD-10-CM | POA: Diagnosis not present

## 2024-04-18 DIAGNOSIS — Z124 Encounter for screening for malignant neoplasm of cervix: Secondary | ICD-10-CM | POA: Diagnosis present

## 2024-04-18 DIAGNOSIS — L309 Dermatitis, unspecified: Secondary | ICD-10-CM | POA: Diagnosis not present

## 2024-04-18 DIAGNOSIS — Z30011 Encounter for initial prescription of contraceptive pills: Secondary | ICD-10-CM

## 2024-04-18 MED ORDER — MIRTAZAPINE 15 MG PO TBDP: 15.0000 mg | ORAL_TABLET | Freq: Every day | ORAL | 3 refills | Status: AC

## 2024-04-18 MED ORDER — LEVONORGEST-ETH ESTRAD 91-DAY 0.15-0.03 &0.01 MG PO TABS: 1.0000 | ORAL_TABLET | Freq: Every day | ORAL | 4 refills | Status: AC

## 2024-04-18 MED ORDER — HYDROXYZINE HCL 25 MG PO TABS: 25.0000 mg | ORAL_TABLET | Freq: Three times a day (TID) | ORAL | 12 refills | Status: AC | PRN

## 2024-04-18 MED ORDER — NICOTINE 7 MG/24HR TD PT24: 7.0000 mg | MEDICATED_PATCH | Freq: Every day | TRANSDERMAL | 0 refills | Status: AC

## 2024-04-18 MED ORDER — TRIAMCINOLONE ACETONIDE 0.5 % EX OINT: 1.0000 | TOPICAL_OINTMENT | Freq: Two times a day (BID) | CUTANEOUS | 0 refills | Status: AC

## 2024-04-18 NOTE — Addendum Note (Signed)
 Addended by: JUSTINO SETTER on: 04/18/2024 08:42 PM   Modules accepted: Orders

## 2024-04-18 NOTE — Progress Notes (Signed)
 ANNUAL GYNECOLOGICAL EXAM  SUBJECTIVE  HPI  Mary Cabrera is a 28 y.o.-year-old G2P1011 who presents for an annual gynecological exam today.  She denies pelvic pain, dyspareunia, abnormal vaginal bleeding or discharge, and UTI symptoms. She is happy with her current method of contraception. She feels the mirtazapine  has been helpful for her mood and sleep.  Medical/Surgical History Past Medical History:  Diagnosis Date   Chlamydia trachomatis infection in pregnancy in second trimester 04/02/2019   Needs Tx, partner referral        History of Lyme disease 2012 - 2013   Nausea & vomiting    with pregnancy   Pain in a tooth or teeth    Positive urine drug screen 04/01/2019   Positive for MJ use     Supervision of other normal pregnancy, antepartum 04/02/2019              Nursing Staff    Provider      Office Location     [facility]    Dating     LMP/13wk      Language     English    Anatomy US             Flu Vaccine          Genetic Screen     NIPS:   AFP:   First Screen:  Quad:          TDaP vaccine          Hgb A1C or   GTT    Early   Third trimester       Rhogam               LAB RESULTS       Feeding Plan         Blood Type            Contraception          Past Surgical History:  Procedure Laterality Date   THERAPEUTIC ABORTION  03/2018    Social History Lives with son and partner. Feels safe there Work: Programmer, systems Exercise: playing with son Substances: Denies EtOH, tobacco, and recreational drugs. +vape  Obstetric History OB History     Gravida  2   Para  1   Term  1   Preterm  0   AB  1   Living  1      SAB  0   IAB  1   Ectopic  0   Multiple  0   Live Births  1            GYN/Menstrual History Patient's last menstrual period was 03/10/2024.  Last Pap: 12/07/2021, NILM Contraception: COCs  Prevention Mammogram: at 39 Colonoscopy: at 44 Flu shot/vaccines  Current Medications Outpatient Medications Prior to Visit  Medication Sig    hydrOXYzine  (ATARAX ) 25 MG tablet Take 1 tablet (25 mg total) by mouth 3 (three) times daily as needed for anxiety.   Levonorgestrel-Ethinyl Estradiol (AMETHIA) 0.15-0.03 &0.01 MG tablet Take 1 tablet by mouth daily.   mirtazapine  (REMERON  SOL-TAB) 15 MG disintegrating tablet Take 1 tablet (15 mg total) by mouth at bedtime.   No facility-administered medications prior to visit.        ROS Constitutional: Denied constitutional symptoms, night sweats, recent illness, fatigue, fever, insomnia and weight loss.  Eyes: Denied eye symptoms, eye pain, photophobia, vision change and visual disturbance.  Ears/Nose/Throat/Neck: Denied ear, nose, throat or neck symptoms, hearing loss, nasal  discharge, sinus congestion and sore throat.  Cardiovascular: Denied cardiovascular symptoms, arrhythmia, chest pain/pressure, edema, exercise intolerance, orthopnea and palpitations.  Respiratory: Denied pulmonary symptoms, asthma, pleuritic pain, productive sputum, cough, dyspnea and wheezing.  Gastrointestinal: Denied gastro-esophageal reflux, melena, nausea and vomiting. +flatulence, decreased bowel movements  Genitourinary: Denied genitourinary symptoms including symptomatic vaginal discharge, pelvic relaxation issues, and urinary complaints.  Musculoskeletal: Denied musculoskeletal symptoms, stiffness, swelling, muscle weakness and myalgia.  Dermatologic: Eczema on elbows and neck  Neurologic: Denied neurology symptoms, dizziness, headache, neck pain and syncope.  Psychiatric: Denied psychiatric symptoms, anxiety and depression.  Endocrine: Denied endocrine symptoms including hot flashes and night sweats.    OBJECTIVE  BP 116/84   Pulse 78   Ht 5' 1 (1.549 m)   Wt 96 lb 6.4 oz (43.7 kg)   LMP 03/10/2024   BMI 18.21 kg/m    Physical examination General NAD, Conversant  HEENT Atraumatic; Op clear with mmm.  Normo-cephalic. Pupils reactive. Anicteric sclerae  Thyroid /Neck Smooth without nodularity or  enlargement. Normal ROM.  Neck Supple.  Skin No rashes, lesions or ulceration. Normal palpated skin turgor. No nodularity.  Breasts: No masses or discharge.  Symmetric.  No axillary adenopathy. Pea-sized nodule in left breast, mobile, non-tender  Lungs: Clear to auscultation.No rales or wheezes. Normal Respiratory effort, no retractions.  Heart: NSR.  No murmurs or rubs appreciated. No peripheral edema  Abdomen: Soft.  Non-tender.  No masses.  No HSM. No hernia  Extremities: Moves all appropriately.  Normal ROM for age. No lymphadenopathy.  Neuro: Oriented to PPT.  Normal mood. Normal affect.     Pelvic:   Vulva: Normal appearance.  No lesions.  Vagina: No lesions or abnormalities noted.  Support: Normal pelvic support.  Urethra No masses tenderness or scarring.  Meatus Normal size without lesions or prolapse.  Cervix: Normal appearance.  No lesions. Pap collected.  Perineum: Normal exam.  No lesions.    ASSESSMENT  1) Annual exam 2) Needs refills of contraception and mirtazapine  3) Eczema  4) Small nodule on breast  PLAN 1) Physical exam as noted. Discussed healthy lifestyle choices and preventive care. STI testing done. Pap collected. Labs: A1C, BMP, B12, CBC, TSH/T4 2) Refills sent 3) Rx sent for triamcinolone   4) Discussed option of breast US  or monitoring of size of nodule. Jahne thinks this nodule has always been there and she prefers to monitor at home. Return in one year for annual exam or as needed for concerns.   Ramesha Poster, CNM

## 2024-04-19 LAB — CBC
Hematocrit: 44.1 % (ref 34.0–46.6)
Hemoglobin: 15.1 g/dL (ref 11.1–15.9)
MCH: 30.9 pg (ref 26.6–33.0)
MCHC: 34.2 g/dL (ref 31.5–35.7)
MCV: 90 fL (ref 79–97)
Platelets: 248 x10E3/uL (ref 150–450)
RBC: 4.89 x10E6/uL (ref 3.77–5.28)
RDW: 12.2 % (ref 11.7–15.4)
WBC: 6.5 x10E3/uL (ref 3.4–10.8)

## 2024-04-19 LAB — HEP, RPR, HIV PANEL
HIV Screen 4th Generation wRfx: NONREACTIVE
Hepatitis B Surface Ag: NEGATIVE
RPR Ser Ql: NONREACTIVE

## 2024-04-19 LAB — HEMOGLOBIN A1C
Est. average glucose Bld gHb Est-mCnc: 105 mg/dL
Hgb A1c MFr Bld: 5.3 % (ref 4.8–5.6)

## 2024-04-19 LAB — BASIC METABOLIC PANEL WITH GFR
BUN/Creatinine Ratio: 8 — ABNORMAL LOW (ref 9–23)
BUN: 7 mg/dL (ref 6–20)
CO2: 19 mmol/L — ABNORMAL LOW (ref 20–29)
Calcium: 9.5 mg/dL (ref 8.7–10.2)
Chloride: 103 mmol/L (ref 96–106)
Creatinine, Ser: 0.91 mg/dL (ref 0.57–1.00)
Glucose: 77 mg/dL (ref 70–99)
Potassium: 3.9 mmol/L (ref 3.5–5.2)
Sodium: 138 mmol/L (ref 134–144)
eGFR: 88 mL/min/1.73 (ref >59)

## 2024-04-19 LAB — LIPID PANEL
Chol/HDL Ratio: 2.6 ratio (ref 0.0–4.4)
Cholesterol, Total: 173 mg/dL (ref 100–199)
HDL: 67 mg/dL (ref >39)
LDL Chol Calc (NIH): 88 mg/dL (ref 0–99)
Triglycerides: 97 mg/dL (ref 0–149)
VLDL Cholesterol Cal: 18 mg/dL (ref 5–40)

## 2024-04-19 LAB — TSH: TSH: 0.822 u[IU]/mL (ref 0.450–4.500)

## 2024-04-22 LAB — CERVICOVAGINAL ANCILLARY ONLY
Bacterial Vaginitis (gardnerella): NEGATIVE
Candida Glabrata: NEGATIVE
Candida Vaginitis: POSITIVE — AB
Chlamydia: NEGATIVE
Comment: NEGATIVE
Comment: NEGATIVE
Comment: NEGATIVE
Comment: NEGATIVE
Comment: NEGATIVE
Comment: NORMAL
Neisseria Gonorrhea: NEGATIVE
Trichomonas: NEGATIVE

## 2024-04-23 ENCOUNTER — Other Ambulatory Visit: Payer: Self-pay | Admitting: Obstetrics

## 2024-04-23 ENCOUNTER — Encounter: Payer: Self-pay | Admitting: Obstetrics

## 2024-04-23 LAB — T4: T4, Total: 10.5 ug/dL (ref 4.5–12.0)

## 2024-04-23 LAB — VITAMIN B12: Vitamin B-12: 498 pg/mL (ref 232–1245)

## 2024-04-23 LAB — SPECIMEN STATUS REPORT

## 2024-04-23 MED ORDER — FLUCONAZOLE 150 MG PO TABS
150.0000 mg | ORAL_TABLET | Freq: Once | ORAL | 1 refills | Status: AC
Start: 2024-04-23 — End: 2024-04-23

## 2024-04-24 LAB — CYTOLOGY - PAP
Adequacy: ABSENT
Comment: NEGATIVE
Diagnosis: UNDETERMINED — AB
High risk HPV: POSITIVE — AB

## 2024-04-25 ENCOUNTER — Ambulatory Visit: Payer: Self-pay | Admitting: Obstetrics

## 2024-07-09 ENCOUNTER — Encounter: Admitting: Obstetrics & Gynecology

## 2024-07-28 ENCOUNTER — Other Ambulatory Visit (HOSPITAL_COMMUNITY)
Admission: RE | Admit: 2024-07-28 | Discharge: 2024-07-28 | Disposition: A | Source: Ambulatory Visit | Attending: Obstetrics & Gynecology | Admitting: Obstetrics & Gynecology

## 2024-07-28 ENCOUNTER — Ambulatory Visit: Admitting: Obstetrics & Gynecology

## 2024-07-28 ENCOUNTER — Encounter: Payer: Self-pay | Admitting: Obstetrics & Gynecology

## 2024-07-28 VITALS — BP 128/76 | HR 66 | Ht 61.0 in | Wt 98.0 lb

## 2024-07-28 DIAGNOSIS — Z3202 Encounter for pregnancy test, result negative: Secondary | ICD-10-CM | POA: Diagnosis not present

## 2024-07-28 DIAGNOSIS — R87810 Cervical high risk human papillomavirus (HPV) DNA test positive: Secondary | ICD-10-CM | POA: Insufficient documentation

## 2024-07-28 DIAGNOSIS — R87610 Atypical squamous cells of undetermined significance on cytologic smear of cervix (ASC-US): Secondary | ICD-10-CM | POA: Insufficient documentation

## 2024-07-28 DIAGNOSIS — N888 Other specified noninflammatory disorders of cervix uteri: Secondary | ICD-10-CM | POA: Diagnosis not present

## 2024-07-28 LAB — POCT URINE PREGNANCY: Preg Test, Ur: NEGATIVE

## 2024-07-28 NOTE — Progress Notes (Addendum)
° ° °  GYNECOLOGY PROGRESS NOTE  Subjective:    Patient ID: Mary Cabrera, female    DOB: 12/28/1995, 28 y.o.   MRN: [id number]  HPI  Patient is a 28 y.o. G45P1011 (73 yo son) here for a colpo due to ASCUS + HR HVP pap recently.   The following portions of the patient's history were reviewed and updated as appropriate: allergies, current medications, past family history, past medical history, past social history, past surgical history, and problem list.  Review of Systems Pertinent items are noted in HPI.   Objective:   Blood pressure 128/76, pulse 66, height 5' 1 (1.549 m), weight 98 lb (44.5 kg). Body mass index is 18.52 kg/m. Well nourished, well hydrated female, no apparent distress She is ambulating and conversing normally. UPT negative, consent signed, time out done Speculum placed. Cervix prepped with acetic acid. Transformation zone seen in its entirety. Colpo adequate. Colposcopic findings normal. ECC obtained. She tolerated the procedure well.    Assessment:   1. ASCUS with positive high risk HPV cervical   2.      Contraception- She is currently taking OCPs but would like to return to Nexplanon as she sometimes forgets to take her OCPs and this is how she conceived her son.  Plan:   1. ASCUS with positive high risk HPV cervical (Primary)  - POCT urine pregnancy - - We discussed that if ECC is negative, then she will need a repeat pap in a year. If abnormal, then I will recommend a LEEP.

## 2024-07-29 LAB — SURGICAL PATHOLOGY

## 2024-08-04 ENCOUNTER — Ambulatory Visit: Payer: Self-pay | Admitting: Obstetrics & Gynecology

## 2024-08-19 ENCOUNTER — Ambulatory Visit: Admitting: Obstetrics & Gynecology
